# Patient Record
Sex: Male | Born: 1945 | Race: White | Hispanic: No | Marital: Single | Smoking: Former smoker
Health system: Southern US, Community
[De-identification: ages and names within clinical notes are randomized; demographics above are authoritative.]

## PROBLEM LIST (undated history)

## (undated) DIAGNOSIS — C61 Malignant neoplasm of prostate: Secondary | ICD-10-CM

## (undated) DIAGNOSIS — M1990 Unspecified osteoarthritis, unspecified site: Secondary | ICD-10-CM

## (undated) DIAGNOSIS — R351 Nocturia: Secondary | ICD-10-CM

## (undated) DIAGNOSIS — G40909 Epilepsy, unspecified, not intractable, without status epilepticus: Secondary | ICD-10-CM

## (undated) DIAGNOSIS — Z87828 Personal history of other (healed) physical injury and trauma: Secondary | ICD-10-CM

## (undated) DIAGNOSIS — I10 Essential (primary) hypertension: Secondary | ICD-10-CM

## (undated) DIAGNOSIS — Z973 Presence of spectacles and contact lenses: Secondary | ICD-10-CM

## (undated) HISTORY — PX: BACK SURGERY: SHX140

## (undated) HISTORY — PX: TONSILLECTOMY: SUR1361

---

## 2001-04-08 ENCOUNTER — Emergency Department (HOSPITAL_COMMUNITY): Admission: EM | Admit: 2001-04-08 | Discharge: 2001-04-08 | Payer: Self-pay | Admitting: Emergency Medicine

## 2001-04-19 ENCOUNTER — Emergency Department (HOSPITAL_COMMUNITY): Admission: EM | Admit: 2001-04-19 | Discharge: 2001-04-19 | Payer: Self-pay | Admitting: Emergency Medicine

## 2001-07-31 ENCOUNTER — Emergency Department (HOSPITAL_COMMUNITY): Admission: EM | Admit: 2001-07-31 | Discharge: 2001-07-31 | Payer: Self-pay | Admitting: Emergency Medicine

## 2002-02-03 ENCOUNTER — Observation Stay (HOSPITAL_COMMUNITY): Admission: RE | Admit: 2002-02-03 | Discharge: 2002-02-04 | Payer: Self-pay | Admitting: General Surgery

## 2002-02-03 HISTORY — PX: INGUINAL HERNIA REPAIR: SUR1180

## 2002-11-16 ENCOUNTER — Encounter: Payer: Self-pay | Admitting: Family Medicine

## 2002-11-16 ENCOUNTER — Ambulatory Visit (HOSPITAL_COMMUNITY): Admission: RE | Admit: 2002-11-16 | Discharge: 2002-11-16 | Payer: Self-pay | Admitting: Family Medicine

## 2003-05-10 ENCOUNTER — Other Ambulatory Visit: Admission: RE | Admit: 2003-05-10 | Discharge: 2003-05-10 | Payer: Self-pay | Admitting: Dermatology

## 2003-05-18 ENCOUNTER — Encounter: Payer: Self-pay | Admitting: Emergency Medicine

## 2003-05-18 ENCOUNTER — Inpatient Hospital Stay (HOSPITAL_COMMUNITY): Admission: AD | Admit: 2003-05-18 | Discharge: 2003-05-20 | Payer: Self-pay | Admitting: Neurosurgery

## 2003-05-19 HISTORY — PX: LAMINECTOMY AND MICRODISCECTOMY LUMBAR SPINE: SHX1913

## 2003-10-05 ENCOUNTER — Ambulatory Visit (HOSPITAL_COMMUNITY): Admission: RE | Admit: 2003-10-05 | Discharge: 2003-10-05 | Payer: Self-pay | Admitting: Family Medicine

## 2005-09-30 ENCOUNTER — Emergency Department (HOSPITAL_COMMUNITY): Admission: EM | Admit: 2005-09-30 | Discharge: 2005-09-30 | Payer: Self-pay | Admitting: Emergency Medicine

## 2005-10-08 ENCOUNTER — Ambulatory Visit (HOSPITAL_COMMUNITY): Admission: RE | Admit: 2005-10-08 | Discharge: 2005-10-08 | Payer: Self-pay | Admitting: Specialist

## 2007-04-05 ENCOUNTER — Ambulatory Visit (HOSPITAL_COMMUNITY): Admission: RE | Admit: 2007-04-05 | Discharge: 2007-04-05 | Payer: Self-pay | Admitting: Family Medicine

## 2007-04-18 ENCOUNTER — Ambulatory Visit (HOSPITAL_COMMUNITY): Admission: RE | Admit: 2007-04-18 | Discharge: 2007-04-18 | Payer: Self-pay | Admitting: Family Medicine

## 2008-01-10 ENCOUNTER — Ambulatory Visit (HOSPITAL_COMMUNITY): Admission: RE | Admit: 2008-01-10 | Discharge: 2008-01-10 | Payer: Self-pay | Admitting: Family Medicine

## 2008-09-05 ENCOUNTER — Ambulatory Visit (HOSPITAL_COMMUNITY): Admission: RE | Admit: 2008-09-05 | Discharge: 2008-09-05 | Payer: Self-pay | Admitting: Family Medicine

## 2009-04-26 ENCOUNTER — Ambulatory Visit (HOSPITAL_COMMUNITY): Admission: RE | Admit: 2009-04-26 | Discharge: 2009-04-26 | Payer: Self-pay | Admitting: Family Medicine

## 2010-06-13 NOTE — H&P (Signed)
NAME:  Darin Williamson, Darin Williamson                           ACCOUNT NO.:  [id number]   MEDICAL RECORD NO.:  [id number]                   PATIENT TYPE:  INP   LOCATION:  3036                                 FACILITY:  [facility]   PHYSICIAN:  Hewitt Shorts, M.D.            DATE OF BIRTH:  April 04, 1945   DATE OF ADMISSION:  05/18/2003  DATE OF DISCHARGE:                                HISTORY & PHYSICAL   HISTORY OF PRESENT ILLNESS:  The patient is a 65 year old right handed white  male who strained his back a couple of weeks ago when he was working a  ladder he had leaned backwards. He said that he did not actually fall but  that when he quickly recovered back onto the ladder he had strained his  back. Initially the discomfort was mild but steadily worsened. He has had  pain in the low back and down to the left lower extremity. This morning, he  experienced severe pain up to the left lower extremity with cramping in the  left calf. It was incapacitating, and he was unable to stand or walk.   He describes the pain as radiating from the side of the low back down to the  left buttock and around the lateral aspect of the left hip, anterior lateral  left thigh, and anterior medial left leg.   The patient presented to the [facility] emergency room and was  evaluated by Dr. Greta Doom. MRI was arranged. He was given dose of  prednisone, subsequently given a dose of Solu-Medrol. The MRI revealed large  left L3-4 lumbar disk herniation, and the emergency room visit from Dr.  Colon Branch contacted his primary physician Dr. Sherwood Gambler who recommended  neurosurgical care. The patient was therefore transferred to [facility] at the request of Dr. Colon Branch and Dr. Sherwood Gambler for  neurosurgical evaluation and treatment.   The patient did not describe any specific numbness, paresthesias, or  weakness. He had been treated by Dr. Sherwood Gambler with anti-inflammatory  medications, but unfortunately his pain  worsened further.   PAST MEDICAL HISTORY:  Notable for seizure disorder since age 29. He takes  Dilantin 200 mg b.i.d. He denies any history of hypertension or myocardial  infarction, cancer, stroke, diabetes, peptic ulcer disease, or lung disease.  His only previous surgery was a herniorrhaphy in January of 2004. He denies  allergies to medications, and his only current medication is Dilantin 200 mg  b.i.d.   FAMILY HISTORY:  His parents are both in good health.   SOCIAL HISTORY:  The patient is unmarried. He works at [employer]. He  smokes a pack a day and does not drink alcoholic beverages.   REVIEW OF SYSTEMS:  Notable for as described in his history of present  illness and past medical history but is otherwise unremarkable.   PHYSICAL EXAMINATION:  GENERAL:  The patient is a well-developed, well-  nourished, white male in no acute distress.  VITAL SIGNS:  His temperature is 97, pulse 55, blood pressure 133/76,  respiratory rate 18.  LUNGS:  Clear to auscultation. He has symmetrical respiratory excursion.  HEART:  Regular rate and rhythm. Normal S1 and S2. There is no murmur.  ABDOMEN:  Soft, nontender, nondistended, bowel sounds are present.  EXTREMITIES:  Shows no clubbing, cyanosis, or edema.  MUSCULOSKELETAL:  Positive straight leg raising on the left at about 45  degrees. Negative straight leg raise on the right.  NEUROLOGICAL:  Shows 5/5 strength in the lower extremities including the  iliopsoas, quadriceps, dorsi flexion, and plantar flexion bilaterally.  Sensation is intact to pinprick through the lower extremities. Reflexes left  quadriceps is 2, right is trace to 1, the right gastrocnemius is 1, the left  is 2. Toes are downgoing bilaterally. Gait and stance were not tested.   IMPRESSION:  Acute left lumbar radiculopathy secondary to a large left L3-4  lumbar disk herniation superimposed upon underlying lumbar spondylosis and  degenerative disk disease.   PLAN:   The patient will be admitted to the 3,000 unit for evaluation and  care. Preoperative laboratories will be obtained. The patient will be  continued on analgesics, and he may well require surgery. Final decision  will be made tomorrow.                                                Hewitt Shorts, M.D.    RWN/MEDQ  D:  05/18/2003  T:  05/20/2003  Job:  [id number]

## 2010-06-13 NOTE — Op Note (Signed)
NAME:  Darin Williamson, Darin Williamson                           ACCOUNT NO.:  [id number]   MEDICAL RECORD NO.:  [id number]                   PATIENT TYPE:  OBV   LOCATION:  A319                                 FACILITY:  [facility]   PHYSICIAN:  Barbaraann Barthel, M.D.              DATE OF BIRTH:  Oct 22, 1945   DATE OF PROCEDURE:  02/03/2002  DATE OF DISCHARGE:                                 OPERATIVE REPORT   PREOPERATIVE DIAGNOSIS:  Right inguinal hernia.   PROCEDURE:  Right inguinal herniorrhaphy (Modified McVay repair).   CLINICAL NOTE:  This is a 65 year old white male who presented with a non-  incarcerated right inguinal hernia.  We had planned elective repair around  his work schedule.  We discussed this in detail with the patient and his  family, including complications not limited to but including bleeding,  infection, and recurrence.  Informed consent was obtained.   SPECIMENS:  Properitoneal (lipoma of the cord) and inguinal hernia sac.   OPERATIVE FINDINGS:  Most consistent with an indirect hernia defect with  large properitoneal fat present and some weakness consistent with a direct  hernia defect.   DESCRIPTION OF PROCEDURE:  The patient was placed in the supine position  after the adequate administration of LMA anesthesia, and his entire abdomen  was prepped with Betadine solution and draped in the usual manner.  Prior to  this, a Foley catheter had been aseptically inserted.  An incision was  carried out through the skin, subcutaneous tissue, and Scarpa's layer in a  low transverse fashion between the anterior superior iliac spine and the  pubic tubercle.  The external oblique was opened through the external ring.  The cord structures were dissected free from the hernia sac and large  lipomas, which were ligated with 2-0 silk and removed as specimen.  The  hernia sac was opened and then suture ligated doubly.  We then sutured  transversus abdominis and transversalis fascia to Cooper's  ligament and  Poupart's ligament with interrupted 2-0 Bralon suture.  I used approximately  9-10 cubic centimeters of 0.5% Sensorcaine to help with postoperative  discomfort and after irrigating with normal saline solution, the cord  structures were then returned to their anatomic position and the external  oblique was approximated over the cord structures with a running 3-0  Polysorb suture.  Subcu was irrigated, and the skin was approximated  with a stapling device.  Prior to closure, all sponge, needle, and  instrument counts were found to be correct.  Estimated blood loss was  minimal.  The patient received approximately 900 cubic centimeters of  crystalloids intraoperatively.  There were no complications.  Barbaraann Barthel, M.D.    WB/MEDQ  D:  02/03/2002  T:  02/03/2002  Job:  [id number]   cc:   Madelin Rear. Sherwood Gambler, M.D.  [id number]  [place]  [place] [address]  Fax: [phone]

## 2010-06-13 NOTE — Op Note (Signed)
NAME:  Darin Williamson, Darin Williamson                           ACCOUNT NO.:  [id number]   MEDICAL RECORD NO.:  [id number]                   PATIENT TYPE:  INP   LOCATION:  3036                                 FACILITY:  [facility]   PHYSICIAN:  Hewitt Shorts, M.D.            DATE OF BIRTH:  July 16, 1945   DATE OF PROCEDURE:  05/19/2003  DATE OF DISCHARGE:  05/20/2003                                 OPERATIVE REPORT   PREOPERATIVE DIAGNOSES:  1. Left L3-4 lumbar disk herniation.  2. Lumbar spondylosis.  3. Lumbar degenerative disk disease.  4. Lumbar radiculopathy.   POSTOPERATIVE DIAGNOSES:  1. Left L3-4 lumbar disk herniation.  2. Lumbar spondylosis.  3. Lumbar degenerative disk disease.  4. Lumbar radiculopathy.   PROCEDURE:  Left L3-4 lumbar laminotomy and microdiskectomy with  microdissection.   SURGEON:  Hewitt Shorts, M.D.   ANESTHESIA:  General endotracheal.   INDICATION:  The patient is a 65 year old man, who presented with an acute  left L4 radiculopathy subsequently to a left L3-4 lumbar disk herniation  superimposed upon underlying degenerative disk disease and spondylosis.  A  decision was made to pursue elective laminotomy and microdiskectomy.   DESCRIPTION OF PROCEDURE:  The patient brought to the operating room, placed  under general endotracheal anesthesia.  The patient was turned to a prone  position; lumbar region is prepped with DuraPrep and draped in a sterile  fashion.  The midline was infiltrated with local anesthetic with  epinephrine.  An x-ray was taken, the L3-4 identified, and a midline made  and carried down to the subcutaneous tissue with bipolar electrocautery.  Electrocautery used to maintain hemostasis.  The incision was carried down  to the lumbar fascia which was incised to the left side of the midline and  the paraspinous muscles with dissection of the spinous process of the lamina  in a subperiosteal fashion.  The L3-4 intralaminar space was  identified and  x-ray was taken to confirm the localization.  And then a laminotomy was  performed using the [facility] Max drill and Kerrison punches.  We draped the  microscope, and it was brought into the field to provide ___________  magnification, illumination, and visualization, and the remainder of the  decompression was performed using microdissection in microsurgical  technique.  The ligamentum flavum was carefully resected, and we were able  to expose the thecal sac and left L4 nerve root.  These structures were  gently retracted medially, and we encountered a free fragment of disk  herniation.  This disk herniation was removed, and we were able to  decompress the thecal sac and nerve root.  We then examined the disk,  specifically the annulus at the L3-4 level and found that it was degenerated  and bulging but felt that the acute neural compression had risen from the  free fragment and that that had been relieved by removing the free fragment  and  that entering the disk space would not really achieve significantly more  decompression.  That being the case, it was decided to establish hemostasis  which was done with bipolar cautery, and then we instilled 2 mL of fentanyl  and 80 mg of Depo-Medrol into the epidural space and proceeded with closure.  The deep fascia was closed with interrupted undyed #1 Vicryl suture, the  subcutaneous layer was closed with interrupted inverted 2-0 interrupted  sutures, and the skin was reapproximated with Dermabond.  The patient  tolerated the procedure well.  The estimated blood loss was 25 mL.  Sponge  and needle count were correct.  Following surgery, the patient was turned  back to a supine position, reversed anesthetic, extubated and transferred to  the recovery room for further care.                                               Hewitt Shorts, M.D.    RWN/MEDQ  D:  05/19/2003  T:  05/20/2003  Job:  [id number]

## 2010-06-13 NOTE — Procedures (Signed)
NAME:  Darin Williamson, Darin Williamson                 ACCOUNT NO.:  [id number]   MEDICAL RECORD NO.:  [id number]          PATIENT TYPE:  OUT   LOCATION:  RAD                           FACILITY:  [facility]   PHYSICIAN:  Dani Gobble, MD       DATE OF BIRTH:  01-11-1946   DATE OF PROCEDURE:  10/08/2005  DATE OF DISCHARGE:                                  ECHOCARDIOGRAM   PROCEDURE:  Echocardiogram.   PHYSICIAN:  Dani Gobble, MD   INDICATIONS FOR PROCEDURE:  This 65 year old gentleman with visual changes,  without a prior cardiac history, who is referred for LV function.   DESCRIPTION OF PROCEDURE:  The technical quality of the study is adequate.  The aorta measures normally at 2.8 cm.  The left atrium appears mildly  dilated.  The anterior septum and posterior wall are mildly thickened and  measure 1.3 cm and 1.4 cm respectively.  The aortic valve appears minimally  thickened but with normal leaflet excursion.  No significant aortic  insufficiency is noted.  Doppler interrogation of the aortic valve is within  normal limits.  The mitral valve also appears normally thickened without  limitation of leaflet excursion.  No mitral valve prolapse is noted.  Mild  mitral annular calcification is noted.  Trace to mild mitral regurgitation  is noted.  Doppler interrogation of the mitral valve is within normal  limits.  The pulmonic valve is incompletely visualized, but appeared to be  grossly structurally normal.  The tricuspid valve also appears grossly  structurally normal with trace to mild tricuspid regurgitation noted.   The left ventricle is normal in size with the LV IDD measured at 4.6 cm and  the LV ISD measured at 2.8 cm.  Overall the left ventricular systolic  function is normal, and no regional wall motion abnormality is noted.  Small  anterior echo free space without hemodynamic compromise which may well be  epicardial fat pad, versus trivial pericardial effusion.   The right atrium appears normal in  size.  The right ventricle is mildly  dilated with preserved right ventricular systolic function.   IMPRESSION:  1. Mild left atrial enlargement.  2. Mild concentric left ventricular hypertrophy.  3. Minimal sclerosis of the aortic and mitral valves, without limitation      to leaflet excursion.  4. Trace to mild mitral and tricuspid regurgitation.  5. Normal left ventricular size and systolic function without regional      wall motion abnormality noted.  6. Mild right ventricular enlargement with preserved right ventricular      systolic function.  7. Small anterior echo free space without hemodynamic compromise, which      may well be epicardial fat pad, versus trivial pericardial effusion.           ______________________________  Dani Gobble, MD     AB/MEDQ  D:  10/08/2005  T:  10/09/2005  Job:  [id number]   cc:   Dani Gobble, MD  Fax: [phone]

## 2010-10-14 ENCOUNTER — Telehealth: Payer: Self-pay

## 2010-10-14 NOTE — Telephone Encounter (Signed)
Pt said he is on vacation this week, goes back to work on Monday. York Spaniel he will call when he is ready.

## 2010-10-22 ENCOUNTER — Telehealth: Payer: Self-pay | Admitting: Gastroenterology

## 2010-10-23 NOTE — Telephone Encounter (Signed)
Called, LMOM to call.  

## 2010-10-23 NOTE — Telephone Encounter (Signed)
Routed to triage nurse

## 2010-10-24 NOTE — Telephone Encounter (Signed)
Pt scheduled for OV on 10/28/2010 @ 8:30 AM with Gerrit Halls, NP. ( Has constipation with a little blood sometimes)

## 2010-10-28 ENCOUNTER — Ambulatory Visit (INDEPENDENT_AMBULATORY_CARE_PROVIDER_SITE_OTHER): Payer: BC Managed Care – PPO | Admitting: Gastroenterology

## 2010-10-28 ENCOUNTER — Encounter: Payer: Self-pay | Admitting: Gastroenterology

## 2010-10-28 VITALS — BP 140/80 | HR 59 | Temp 97.4°F | Ht 69.0 in | Wt 183.8 lb

## 2010-10-28 DIAGNOSIS — Z1211 Encounter for screening for malignant neoplasm of colon: Secondary | ICD-10-CM | POA: Insufficient documentation

## 2010-10-28 NOTE — Progress Notes (Signed)
  Referring Provider: Dr. Phillips Odor Primary Care Physician:  Dr. Phillips Odor Primary Gastroenterologist:  Dr. Jena Gauss   Chief Complaint  Patient presents with  . Colonoscopy    HPI:   Darin Williamson presents today for an initial screening colonoscopy, referred by Dr. Phillips Odor. No change in bowel habits, has BM every 2-3 days. He has had intermittent episodes of paper hematochezia. No rectal discomfort. No loss of appetite or wt loss. No abdominal pain, nausea or vomiting. No upper GI symptoms. No FH of colon cancer.   Past Medical History  Diagnosis Date  . Seizure     Hx of seizures many years ago  . Hyperlipidemia     Past Surgical History  Procedure Date  . Inguinal hernia repair     right side    Current Outpatient Prescriptions  Medication Sig Dispense Refill  . phenytoin (DILANTIN) 100 MG ER capsule Take 100 mg by mouth 3 (three) times daily. 2 cap by mouth in the am One cap in the evening         Allergies as of 10/28/2010  . (No Known Allergies)    Family History  Problem Relation Age of Onset  . Colon cancer Neg Hx     History   Social History  . Marital Status: Single    Spouse Name: N/A    Number of Children: 0  . Years of Education: N/A   Occupational History  . Full-Time [employer]   Social History Main Topics  . Smoking status: Former Smoker -- 0.5 packs/day    Types: Cigarettes  . Smokeless tobacco: Not on file   Comment: quit 12 yrs ago  . Alcohol Use: No  . Drug Use: No  . Sexually Active: Not on file   Other Topics Concern  . Not on file   Social History Narrative  . No narrative on file    Review of Systems: Gen: Denies any fever, chills, loss of appetite, fatigue, weight loss. CV: Denies chest pain, heart palpitations, syncope, peripheral edema. Resp: Denies shortness of breath with rest, cough, wheezing GI: Denies dysphagia or odynophagia. Denies hematemesis, fecal incontinence, or jaundice.  GU : Denies urinary burning, urinary frequency,  urinary incontinence.  MS: Denies joint pain, muscle weakness, cramps, limited movement Derm: Denies rash, itching, dry skin Psych: Denies depression, anxiety, confusion or memory loss  Heme: Denies bruising, bleeding, and enlarged lymph nodes.  Physical Exam: BP 140/80  Pulse 59  Temp(Src) 97.4 F (36.3 C) (Temporal)  Ht 5\' 9"  (1.753 m)  Wt 183 lb 12.8 oz (83.371 kg)  BMI 27.14 kg/m2 General:   Alert and oriented. Well-developed, well-nourished, pleasant and cooperative. Head:  Normocephalic and atraumatic. Eyes:  Conjunctiva pink, sclera clear, no icterus.    Ears:  Normal auditory acuity. Nose:  No deformity, discharge,  or lesions. Mouth:  No deformity or lesions, mucosa pink and moist.  Neck:  Supple, without mass or thyromegaly. Lungs:  Clear to auscultation bilaterally, without wheezing, rales, or rhonchi.  Heart:  S1, S2 present without murmurs noted.  Abdomen:  +BS, soft, non-tender and non-distended. Without mass or HSM. No rebound or guarding. Small umbilical hernia noted. Rectal:  Deferred  Msk:  Symmetrical without gross deformities. Normal posture. Extremities:  Without clubbing or edema. Neurologic:  Alert and  oriented x4;  grossly normal neurologically. Skin:  Intact, warm and dry without significant lesions or rashes Cervical Nodes:  No significant cervical adenopathy. Psych:  Alert and cooperative. Normal mood and affect.

## 2010-10-28 NOTE — Progress Notes (Signed)
Cc to PCP 

## 2010-10-28 NOTE — Assessment & Plan Note (Signed)
65 year old male referred by Dr. Phillips Odor for first ever colonoscopy, age 65. He has no FH of colon cancer; he has noted intermittent episodes of paper hematochezia. Otherwise, he has had no change in bowel habits, abdominal pain, loss of appetite or unexplained weight loss. Likely low-volume hematochezia secondary to benign anorectal source. Patient needs screening colonoscopy, and we will proceed in this fashion.  Proceed with TCS with Dr. Jena Gauss in near future: the risks, benefits, and alternatives have been discussed with the patient in detail. The patient states understanding and desires to proceed.

## 2010-10-28 NOTE — Patient Instructions (Signed)
We have set you up for a colonoscopy with Dr. Jena Gauss.  No changes to medications.  Further recommendations to follow.

## 2010-11-10 MED ORDER — SODIUM CHLORIDE 0.45 % IV SOLN
Freq: Once | INTRAVENOUS | Status: AC
  Administered 2010-11-11: 11:00:00 via INTRAVENOUS

## 2010-11-11 ENCOUNTER — Encounter (HOSPITAL_COMMUNITY)
Admission: RE | Disposition: A | Discharge status: Home / Self Care | Payer: Self-pay | Source: Ambulatory Visit | Attending: Internal Medicine

## 2010-11-11 ENCOUNTER — Encounter (HOSPITAL_COMMUNITY): Payer: Self-pay | Admitting: *Deleted

## 2010-11-11 ENCOUNTER — Ambulatory Visit (HOSPITAL_COMMUNITY)
Admission: RE | Admit: 2010-11-11 | Discharge: 2010-11-11 | Disposition: A | Discharge status: Home / Self Care | Payer: BC Managed Care – PPO | Source: Ambulatory Visit | Attending: Internal Medicine | Admitting: Internal Medicine

## 2010-11-11 DIAGNOSIS — Z1211 Encounter for screening for malignant neoplasm of colon: Secondary | ICD-10-CM | POA: Insufficient documentation

## 2010-11-11 DIAGNOSIS — K5730 Diverticulosis of large intestine without perforation or abscess without bleeding: Secondary | ICD-10-CM | POA: Insufficient documentation

## 2010-11-11 DIAGNOSIS — K648 Other hemorrhoids: Secondary | ICD-10-CM

## 2010-11-11 DIAGNOSIS — E785 Hyperlipidemia, unspecified: Secondary | ICD-10-CM | POA: Insufficient documentation

## 2010-11-11 HISTORY — PX: COLONOSCOPY: SHX5424

## 2010-11-11 SURGERY — COLONOSCOPY
Anesthesia: Moderate Sedation

## 2010-11-11 MED ORDER — MIDAZOLAM HCL 5 MG/5ML IJ SOLN
INTRAMUSCULAR | Status: AC
  Filled 2010-11-11: qty 10

## 2010-11-11 MED ORDER — MEPERIDINE HCL 100 MG/ML IJ SOLN
INTRAMUSCULAR | Status: AC
  Filled 2010-11-11: qty 2

## 2010-11-11 MED ORDER — MEPERIDINE HCL 100 MG/ML IJ SOLN
INTRAMUSCULAR | Status: DC | PRN
  Administered 2010-11-11: 50 mg via INTRAVENOUS
  Administered 2010-11-11: 25 mg via INTRAVENOUS

## 2010-11-11 MED ORDER — MIDAZOLAM HCL 5 MG/5ML IJ SOLN
INTRAMUSCULAR | Status: DC | PRN
  Administered 2010-11-11: 2 mg via INTRAVENOUS
  Administered 2010-11-11: 1 mg via INTRAVENOUS

## 2010-11-11 NOTE — H&P (Signed)
Darin Halls, NP  10/28/2010 10:09 AM  Signed   Referring Provider: Dr. Phillips Odor Primary Care Physician:  Dr. Phillips Odor Primary Gastroenterologist:  Dr. Jena Gauss     Chief Complaint   Patient presents with   .  Colonoscopy      HPI:   Mr. Novosad presents today for an initial screening colonoscopy, referred by Dr. Phillips Odor. No change in bowel habits, has BM every 2-3 days. He has had intermittent episodes of paper hematochezia. No rectal discomfort. No loss of appetite or wt loss. No abdominal pain, nausea or vomiting. No upper GI symptoms. No FH of colon cancer.      Past Medical History   Diagnosis  Date   .  Seizure         Hx of seizures many years ago   .  Hyperlipidemia         Past Surgical History   Procedure  Date   .  Inguinal hernia repair         right side       Current Outpatient Prescriptions   Medication  Sig  Dispense  Refill   .  phenytoin (DILANTIN) 100 MG ER capsule  Take 100 mg by mouth 3 (three) times daily. 2 cap by mouth in the am  One cap in the evening              Allergies as of 10/28/2010   .  (No Known Allergies)       Family History   Problem  Relation  Age of Onset   .  Colon cancer  Neg Hx         History       Social History   .  Marital Status:  Single       Spouse Name:  N/A       Number of Children:  0   .  Years of Education:  N/A       Occupational History   .  Full-Time  [employer]       Social History Main Topics   .  Smoking status:  Former Smoker -- 0.5 packs/day       Types:  Cigarettes   .  Smokeless tobacco:  Not on file     Comment: quit 12 yrs ago   .  Alcohol Use:  No   .  Drug Use:  No   .  Sexually Active:  Not on file       Other Topics  Concern   .  Not on file       Social History Narrative   .  No narrative on file      Review of Systems: Gen: Denies any fever, chills, loss of appetite, fatigue, weight loss. CV: Denies chest pain, heart palpitations, syncope, peripheral edema. Resp: Denies  shortness of breath with rest, cough, wheezing GI: Denies dysphagia or odynophagia. Denies hematemesis, fecal incontinence, or jaundice.   GU : Denies urinary burning, urinary frequency, urinary incontinence.   MS: Denies joint pain, muscle weakness, cramps, limited movement Derm: Denies rash, itching, dry skin Psych: Denies depression, anxiety, confusion or memory loss   Heme: Denies bruising, bleeding, and enlarged lymph nodes.   Physical Exam: BP 140/80  Pulse 59  Temp(Src) 97.4 F (36.3 C) (Temporal)  Ht 5\' 9"  (1.753 m)  Wt 183 lb 12.8 oz (83.371 kg)  BMI 27.14 kg/m2 General:   Alert and oriented. Well-developed, well-nourished, pleasant and cooperative. Head:  Normocephalic  and atraumatic. Eyes:  Conjunctiva pink, sclera clear, no icterus.     Ears:  Normal auditory acuity. Nose:  No deformity, discharge,  or lesions. Mouth:  No deformity or lesions, mucosa pink and moist.   Neck:  Supple, without mass or thyromegaly. Lungs:  Clear to auscultation bilaterally, without wheezing, rales, or rhonchi.   Heart:  S1, S2 present without murmurs noted.   Abdomen:  +BS, soft, non-tender and non-distended. Without mass or HSM. No rebound or guarding. Small umbilical hernia noted. Rectal:  Deferred   Msk:  Symmetrical without gross deformities. Normal posture. Extremities:  Without clubbing or edema. Neurologic:  Alert and  oriented x4;  grossly normal neurologically. Skin:  Intact, warm and dry without significant lesions or rashes Cervical Nodes:  No significant cervical adenopathy. Psych:  Alert and cooperative. Normal mood and affect.       Glendora Score  10/28/2010 10:24 AM  Signed Cc to PCP        Screening for colon cancer - Darin Halls, NP  10/28/2010 10:09 AM  Signed 65 year old male referred by Dr. Phillips Odor for first ever colonoscopy, age 65. He has no FH of colon cancer; he has noted intermittent episodes of paper hematochezia. Otherwise, he has had no change in bowel  habits, abdominal pain, loss of appetite or unexplained weight loss. Likely low-volume hematochezia secondary to benign anorectal source. Patient needs screening colonoscopy, and we will proceed in this fashion.   Proceed with TCS with Dr. Jena Gauss in near future: the risks, benefits, and alternatives have been discussed with the patient in detail. The patient states understanding and desires to proceed.      I have seen & examined the patient prior to the procedure(s) today and reviewed the history and physical/consultation.  There have been no changes.  After consideration of the risks, benefits, alternatives and imponderables, the patient has consented to the procedure(s).

## 2010-11-11 NOTE — Discharge Instructions (Signed)
°  Colonoscopy Discharge Instructions  Read the instructions outlined below and refer to this sheet in the next few weeks. These discharge instructions provide you with general information on caring for yourself after you leave the hospital. Your doctor may also give you specific instructions. While your treatment has been planned according to the most current medical practices available, unavoidable complications occasionally occur. If you have any problems or questions after discharge, call Dr. Jena Gauss at [phone]. ACTIVITY  You may resume your regular activity, but move at a slower pace for the next 24 hours.   Take frequent rest periods for the next 24 hours.   Walking will help get rid of the air and reduce the bloated feeling in your belly (abdomen).   No driving for 24 hours (because of the medicine (anesthesia) used during the test).    Do not sign any important legal documents or operate any machinery for 24 hours (because of the anesthesia used during the test).  NUTRITION  Drink plenty of fluids.   You may resume your normal diet as instructed by your doctor.   Begin with a light meal and progress to your normal diet. Heavy or fried foods are harder to digest and may make you feel sick to your stomach (nauseated).   Avoid alcoholic beverages for 24 hours or as instructed.  MEDICATIONS  You may resume your normal medications unless your doctor tells you otherwise.  WHAT YOU CAN EXPECT TODAY  Some feelings of bloating in the abdomen.   Passage of more gas than usual.   Spotting of blood in your stool or on the toilet paper.  IF YOU HAD POLYPS REMOVED DURING THE COLONOSCOPY:  No aspirin products for 7 days or as instructed.   No alcohol for 7 days or as instructed.   Eat a soft diet for the next 24 hours.  FINDING OUT THE RESULTS OF YOUR TEST Not all test results are available during your visit. If your test results are not back during the visit, make an appointment  with your caregiver to find out the results. Do not assume everything is normal if you have not heard from your caregiver or the medical facility. It is important for you to follow up on all of your test results.  SEEK IMMEDIATE MEDICAL ATTENTION IF:  You have more than a spotting of blood in your stool.   Your belly is swollen (abdominal distention).   You are nauseated or vomiting.   You have a temperature over 101.   You have abdominal pain or discomfort that is severe or gets worse throughout the day.   Diverticulosis and hemorrhoid literature provided to the patient.  Repeat screening colonoscopy 10 years.

## 2010-11-20 ENCOUNTER — Encounter (HOSPITAL_COMMUNITY): Payer: Self-pay | Admitting: Internal Medicine

## 2010-12-25 NOTE — Progress Notes (Signed)
REVIEWED. AGREE. 

## 2011-10-27 ENCOUNTER — Ambulatory Visit (INDEPENDENT_AMBULATORY_CARE_PROVIDER_SITE_OTHER): Payer: BC Managed Care – PPO | Admitting: Urology

## 2011-10-27 DIAGNOSIS — R972 Elevated prostate specific antigen [PSA]: Secondary | ICD-10-CM

## 2011-11-06 ENCOUNTER — Other Ambulatory Visit: Payer: Self-pay | Admitting: Urology

## 2011-11-06 DIAGNOSIS — C61 Malignant neoplasm of prostate: Secondary | ICD-10-CM

## 2011-11-24 ENCOUNTER — Other Ambulatory Visit: Payer: Self-pay | Admitting: Urology

## 2011-11-24 ENCOUNTER — Ambulatory Visit (HOSPITAL_COMMUNITY)
Admission: RE | Admit: 2011-11-24 | Discharge: 2011-11-24 | Disposition: A | Discharge status: Home / Self Care | Payer: BC Managed Care – PPO | Source: Ambulatory Visit | Attending: Urology | Admitting: Urology

## 2011-11-24 DIAGNOSIS — C61 Malignant neoplasm of prostate: Secondary | ICD-10-CM

## 2011-11-24 HISTORY — PX: PROSTATE BIOPSY: SHX241

## 2011-11-24 NOTE — Progress Notes (Signed)
Lidocaine 2%           10mL injected                      Prostate biopsies performed

## 2011-12-29 ENCOUNTER — Institutional Professional Consult (permissible substitution) (INDEPENDENT_AMBULATORY_CARE_PROVIDER_SITE_OTHER): Payer: BC Managed Care – PPO | Admitting: Urology

## 2011-12-29 DIAGNOSIS — C61 Malignant neoplasm of prostate: Secondary | ICD-10-CM

## 2012-03-29 ENCOUNTER — Ambulatory Visit (INDEPENDENT_AMBULATORY_CARE_PROVIDER_SITE_OTHER): Payer: BC Managed Care – PPO | Admitting: Urology

## 2012-03-29 DIAGNOSIS — C61 Malignant neoplasm of prostate: Secondary | ICD-10-CM

## 2012-08-02 ENCOUNTER — Ambulatory Visit (INDEPENDENT_AMBULATORY_CARE_PROVIDER_SITE_OTHER): Payer: BC Managed Care – PPO | Admitting: Urology

## 2012-08-02 DIAGNOSIS — C61 Malignant neoplasm of prostate: Secondary | ICD-10-CM

## 2012-11-29 ENCOUNTER — Encounter (INDEPENDENT_AMBULATORY_CARE_PROVIDER_SITE_OTHER): Payer: Self-pay

## 2012-11-29 ENCOUNTER — Ambulatory Visit (INDEPENDENT_AMBULATORY_CARE_PROVIDER_SITE_OTHER): Payer: Medicare Other | Admitting: Urology

## 2012-11-29 ENCOUNTER — Other Ambulatory Visit: Payer: Self-pay | Admitting: Urology

## 2012-11-29 DIAGNOSIS — C61 Malignant neoplasm of prostate: Secondary | ICD-10-CM

## 2013-04-04 ENCOUNTER — Ambulatory Visit (HOSPITAL_COMMUNITY)
Admission: RE | Admit: 2013-04-04 | Discharge: 2013-04-04 | Disposition: A | Discharge status: Home / Self Care | Payer: Medicare HMO | Source: Ambulatory Visit | Attending: Urology | Admitting: Urology

## 2013-04-04 ENCOUNTER — Encounter (HOSPITAL_COMMUNITY): Payer: Self-pay

## 2013-04-04 ENCOUNTER — Other Ambulatory Visit: Payer: Self-pay | Admitting: Urology

## 2013-04-04 VITALS — BP 164/94 | HR 68 | Temp 97.0°F | Resp 16

## 2013-04-04 DIAGNOSIS — C61 Malignant neoplasm of prostate: Secondary | ICD-10-CM | POA: Insufficient documentation

## 2013-04-04 HISTORY — DX: Malignant neoplasm of prostate: C61

## 2013-04-04 HISTORY — PX: PROSTATE BIOPSY: SHX241

## 2013-04-04 MED ORDER — LIDOCAINE HCL (PF) 2 % IJ SOLN
10.0000 mL | Freq: Once | INTRAMUSCULAR | Status: AC
  Administered 2013-04-04: 10 mL

## 2013-04-04 MED ORDER — GENTAMICIN SULFATE 40 MG/ML IJ SOLN
160.0000 mg | Freq: Once | INTRAMUSCULAR | Status: AC
  Administered 2013-04-04: 160 mg via INTRAMUSCULAR

## 2013-04-04 MED ORDER — LIDOCAINE HCL (PF) 2 % IJ SOLN
INTRAMUSCULAR | Status: AC
  Administered 2013-04-04: 10 mL
  Filled 2013-04-04: qty 10

## 2013-04-04 MED ORDER — GENTAMICIN SULFATE 40 MG/ML IJ SOLN
INTRAMUSCULAR | Status: AC
  Administered 2013-04-04: 160 mg via INTRAMUSCULAR
  Filled 2013-04-04: qty 4

## 2013-04-04 NOTE — Discharge Instructions (Signed)
Prostate Biopsy TRUS Biopsy BEFORE THE TEST   Do not take aspirin. Do not take any medicine that has aspirin in it 7 days before your biopsy.  You may be given a medicine to take on the day of your biopsy.  You may also be given a medicine or treatment to help you go poop (laxative or enema). AFTER THE TEST  Only take medicine as told by your doctor.  It is normal to have some bleeding from your rectum for the first 5 days.  You may have blood in your pee (urine) or sperm. Finding out the results of your test Ask when your test results will be ready. Make sure you get your test results. GET HELP RIGHT AWAY IF:  You have a temperature by mouth above 102 F (38.9 C), not controlled by medicine.  You have blood in your pee for more than 5 days.  You have a lot of blood in your pee.  You have bleeding from your rectum for more than 5 days or have a lot of blood in your poop (feces).  You have severe pain. Document Released: 12/31/2008 Document Revised: 04/06/2011 Document Reviewed: 08/31/2012 [facility] Patient Information 2014 [place], [place].

## 2013-04-04 NOTE — Progress Notes (Signed)
Biopsy complete no signs of distress  

## 2013-04-11 ENCOUNTER — Ambulatory Visit (INDEPENDENT_AMBULATORY_CARE_PROVIDER_SITE_OTHER): Payer: Medicare HMO | Admitting: Urology

## 2013-04-11 DIAGNOSIS — C61 Malignant neoplasm of prostate: Secondary | ICD-10-CM

## 2013-04-17 ENCOUNTER — Encounter: Payer: Self-pay | Admitting: Urology

## 2013-05-10 ENCOUNTER — Encounter: Payer: Self-pay | Admitting: Radiation Oncology

## 2013-05-10 NOTE — Progress Notes (Signed)
GU Location of Tumor / Histology: prostate  If Prostate Cancer, Gleason Score is (3 + 4) and PSA is (4.31 on 11/28/12)  Patient presented 1.5 years ago with signs/symptoms of: elevated PSA of 4.44 for his age  Biopsies of prostate (if applicable) revealed: 40/34/74  Adenocarcinoma, 1 /12 cores, Gleason 3+3=6, volume 42 cc  04/04/13 Prostate Biopsy   Past/Anticipated interventions by urology, if any: surveillance and repeat prostate biopsy after initial bx in 2013  Past/Anticipated interventions by medical oncology, if any: none  Weight changes, if any: no  Bowel/Bladder complaints, if any:  Occasional sensation of not emptying bladder, frequency, stream starts/stops,  IPSS 4  Nausea/Vomiting, if any: no  Pain issues, if any:  no  SAFETY ISSUES:  Prior radiation? no  Pacemaker/ICD? no  Possible current pregnancy? na  Is the patient on methotrexate? no  Current Complaints / other details:  Single, works in Theatre manager at [employer], [place]. No children. Brother w/pt today.

## 2013-05-11 ENCOUNTER — Encounter: Payer: Self-pay | Admitting: Radiation Oncology

## 2013-05-11 ENCOUNTER — Ambulatory Visit
Admission: RE | Admit: 2013-05-11 | Discharge: 2013-05-11 | Disposition: A | Discharge status: Home / Self Care | Payer: Medicare HMO | Source: Ambulatory Visit | Attending: Radiation Oncology | Admitting: Radiation Oncology

## 2013-05-11 VITALS — BP 168/86 | HR 59 | Temp 98.0°F | Resp 20 | Ht 69.0 in | Wt 188.4 lb

## 2013-05-11 DIAGNOSIS — C61 Malignant neoplasm of prostate: Secondary | ICD-10-CM

## 2013-05-11 HISTORY — DX: Malignant neoplasm of prostate: C61

## 2013-05-11 HISTORY — DX: Unspecified osteoarthritis, unspecified site: M19.90

## 2013-05-11 NOTE — Progress Notes (Signed)
[facility] NEW PATIENT EVALUATION  Name: DEARIES MEIKLE MRN: [id number]  Date:   05/11/2013           DOB: 1945/08/04  Status: outpatient   CC: Leonides Grills, MD  Franchot Gallo, MD    REFERRING PHYSICIAN: Franchot Gallo, MD   DIAGNOSIS: Stage TI C. intermediate risk adenocarcinoma prostate   HISTORY OF PRESENT ILLNESS:  Darin Williamson is a 68 y.o. male who is seen today through the courtesy Dr. Diona Fanti for consideration of radiation therapy in the management of his stage TI C. intermediate risk adenocarcinoma prostate. He was noted to have a PSA of 4.44 in the fall of 2013. Ultrasound-guided biopsies revealed 1 of 12 cores containing Gleason 6 (3+3) carcinoma. The core had 40% involvement. His gland volume was approximately 42 cc. He elected for close observation/surveillance.  A repeat PSA November of this past year was 4.31. Repeat ultrasound-guided biopsies on 04/04/2013 revealed Gleason 7 (3+4) involving 5% of one core from the left lateral mid gland along with Gleason 6 (3+3) involving 5% of one core from the left lateral apex and less than 5% of one core from the right apex. His gland volume was approximately 40 cc. He is doing well from a GU and GI standpoint. His I PSS score is 4. He states that he is able to have erections.  PREVIOUS RADIATION THERAPY: No   PAST MEDICAL HISTORY:  has a past medical history of Seizure; Prostate cancer (11/24/11); Malignant neoplasm of prostate (04/04/13); and Arthritis.     PAST SURGICAL HISTORY:  Past Surgical History  Procedure Laterality Date  . Inguinal hernia repair  2004    right side  . Colonoscopy  11/11/2010    Procedure: COLONOSCOPY;  Surgeon: Daneil Dolin, MD;  [facility];  Service: Endoscopy;  Laterality: N/A;  11:30  . Prostate biopsy  11/24/11    Gleason 3, 1/12 cores positive  . Prostate biopsy  04/04/13    Gleason 7, 3/12 cores positive  . Tonsillectomy        FAMILY HISTORY: family history includes Cancer (age of onset: 26) in his cousin; Heart attack in his father. There is no history of Colon cancer. His father died of a heart attack at 44. His mother is alive and well at 77. Question of prostate cancer in a paternal cousin at age 17.   SOCIAL HISTORY:  reports that he quit smoking about 13 years ago. His smoking use included Cigarettes. He has a 52.5 pack-year smoking history. He does not have any smokeless tobacco history on file. He reports that he does not drink alcohol or use illicit drugs. Single, never married, no children. He works in Theatre manager at [employer].   ALLERGIES: Prednisone   MEDICATIONS:  Current Outpatient Prescriptions  Medication Sig Dispense Refill  . phenytoin (DILANTIN) 100 MG ER capsule Take 100 mg by mouth 3 (three) times daily. 2 cap by mouth in the am One cap in the evening        No current facility-administered medications for this encounter.     REVIEW OF SYSTEMS:  Pertinent items are noted in HPI.    PHYSICAL EXAM:  height is 5\' 9"  (1.753 m) and weight is 188 lb 6.4 oz (85.458 kg). His oral temperature is 98 F (36.7 C). His blood pressure is 168/86 and his pulse is 59. His respiration is 20.   Alert and oriented. Head and neck examination: Grossly unremarkable. Nodes: Without  palpable cervical or supraclavicular lymphadenopathy. Chest: Lungs clear. Back: Without spinal or CVA tenderness. Abdomen: Without masses organomegaly. Genitalia: Unremarkable to inspection. Rectal: The prostate gland is normal size and is without focal induration or nodularity. Extremities: Without edema.   LABORATORY DATA:  No results found for this basename: WBC, HGB, HCT, MCV, PLT   No results found for this basename: NA, K, CL, CO2   No results found for this basename: ALT, AST, GGT, ALKPHOS, BILITOT   PSA 4.31 from 11/28/2012   IMPRESSION: Stage TI C. intermediate risk adenocarcinoma prostate. I explained to the  patient and his brother that his prognosis is related to his stage, Gleason score, and PSA level. His stage and PSA level are favorable while his Gleason score of 7 is of intermediate favorability. Management options include surgery versus continued close surveillance/observation versus radiation therapy. Radiation therapy options include seed implant alone versus external beam/IMRT. We discussed the potential acute and late toxicities of radiation therapy. After lengthy discussion he is most interested in seed implantation which I think would be an excellent choice for him. His prognosis is favorable. We will move ahead with a CT arch study today and get him scheduled for seed implantation with Dr. Diona Fanti later in May.   PLAN: As discussed above.  I spent 60 minutes minutes face to face with the patient and more than 50% of that time was spent in counseling and/or coordination of care.

## 2013-05-11 NOTE — Progress Notes (Signed)
   Complex simulation/treatment planning note: The patient was taken to the CT simulator. His pelvis was scanned. The CT data set was sent to the planning system for contouring of his prostate. The prostate was projected over the bony arch. The arch is open. His prostate volume is 39.3 cc. He is a candidate for seed implantation. I am prescribing 14,500 cGy utilizing I-125 seeds. He is to be implanted with the [employer] system.

## 2013-05-11 NOTE — Progress Notes (Signed)
Please see the Nurse Progress Note in the MD Initial Consult Encounter for this patient. 

## 2013-05-11 NOTE — Addendum Note (Signed)
Encounter addended by: Andria Rhein, RN on: 05/11/2013  1:59 PM<BR>     Documentation filed: Charges VN

## 2013-05-11 NOTE — Progress Notes (Signed)
CC: Dr. Preston Fleeting   Chart note:    Darin Williamson underwent a CT arch study. His prostate volume is 39.3 cc and his arch is widely open. He is a candidate for seed implantation with Dr. Diona Fanti.

## 2013-05-12 ENCOUNTER — Telehealth: Payer: Self-pay | Admitting: *Deleted

## 2013-05-12 NOTE — Telephone Encounter (Signed)
CALLED PATIENT TO INFORM OF IMPLANT DATE, LVM FOR A RETURN CALL 

## 2013-05-16 ENCOUNTER — Other Ambulatory Visit: Payer: Self-pay | Admitting: Urology

## 2013-05-17 ENCOUNTER — Ambulatory Visit (HOSPITAL_BASED_OUTPATIENT_CLINIC_OR_DEPARTMENT_OTHER)
Admission: RE | Admit: 2013-05-17 | Discharge: 2013-05-17 | Disposition: A | Discharge status: Home / Self Care | Payer: Medicare HMO | Source: Ambulatory Visit | Attending: Urology | Admitting: Urology

## 2013-05-17 ENCOUNTER — Encounter (HOSPITAL_BASED_OUTPATIENT_CLINIC_OR_DEPARTMENT_OTHER)
Admission: RE | Admit: 2013-05-17 | Discharge: 2013-05-17 | Disposition: A | Discharge status: Home / Self Care | Payer: Medicare HMO | Source: Ambulatory Visit | Attending: Urology | Admitting: Urology

## 2013-05-17 DIAGNOSIS — C61 Malignant neoplasm of prostate: Secondary | ICD-10-CM | POA: Insufficient documentation

## 2013-05-17 DIAGNOSIS — Z01818 Encounter for other preprocedural examination: Secondary | ICD-10-CM | POA: Insufficient documentation

## 2013-06-02 ENCOUNTER — Telehealth: Payer: Self-pay | Admitting: *Deleted

## 2013-06-02 NOTE — Telephone Encounter (Signed)
Called patient to inform that has been moved time has been moved to 12 pm (start time) on 06-29-13, lvm for a return call

## 2013-06-21 ENCOUNTER — Telehealth: Payer: Self-pay | Admitting: *Deleted

## 2013-06-21 NOTE — Telephone Encounter (Signed)
CALLED PATIENT TO REMIND OF APPT. FOR 06-22-13, LVM FOR A RETURN CALL 

## 2013-06-22 ENCOUNTER — Encounter (HOSPITAL_BASED_OUTPATIENT_CLINIC_OR_DEPARTMENT_OTHER): Payer: Self-pay | Admitting: *Deleted

## 2013-06-22 LAB — COMPREHENSIVE METABOLIC PANEL
ALK PHOS: 100 U/L (ref 39–117)
ALT: 17 U/L (ref 0–53)
AST: 13 U/L (ref 0–37)
Albumin: 4.2 g/dL (ref 3.5–5.2)
BILIRUBIN TOTAL: 0.4 mg/dL (ref 0.3–1.2)
BUN: 16 mg/dL (ref 6–23)
CHLORIDE: 105 meq/L (ref 96–112)
CO2: 25 meq/L (ref 19–32)
Calcium: 9.4 mg/dL (ref 8.4–10.5)
Creatinine, Ser: 0.8 mg/dL (ref 0.50–1.35)
GFR calc Af Amer: >90 mL/min (ref >90)
Glucose, Bld: 118 mg/dL — ABNORMAL HIGH (ref 70–99)
Potassium: 4.2 mEq/L (ref 3.7–5.3)
SODIUM: 144 meq/L (ref 137–147)
Total Protein: 7.1 g/dL (ref 6.0–8.3)

## 2013-06-22 LAB — PROTIME-INR
INR: 1 (ref 0.00–1.49)
Prothrombin Time: 13 seconds (ref 11.6–15.2)

## 2013-06-22 LAB — CBC
HCT: 43.5 % (ref 39.0–52.0)
HEMOGLOBIN: 15.3 g/dL (ref 13.0–17.0)
MCH: 32.5 pg (ref 26.0–34.0)
MCHC: 35.2 g/dL (ref 30.0–36.0)
MCV: 92.4 fL (ref 78.0–100.0)
PLATELETS: 241 10*3/uL (ref 150–400)
RBC: 4.71 MIL/uL (ref 4.22–5.81)
RDW: 13 % (ref 11.5–15.5)
WBC: 8.3 10*3/uL (ref 4.0–10.5)

## 2013-06-22 LAB — APTT: aPTT: 31 seconds (ref 24–37)

## 2013-06-22 NOTE — Progress Notes (Signed)
NPO AFTER MN. ARRIVE AT 1030.  CURRENT LAB RESULTS , EKG , AND CXR IN EPIC AND CHART. WILL TAKE DILANTIN AM DOS W/ SIPS OF WATER AND DO FLEET ENEMA.

## 2013-06-28 ENCOUNTER — Telehealth: Payer: Self-pay | Admitting: *Deleted
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
## 2013-06-28 NOTE — Telephone Encounter (Signed)
Called patient to remind of procedure for 06-29-13, spoke with patient and he is aware of this procedure. 

## 2013-06-29 ENCOUNTER — Encounter (HOSPITAL_BASED_OUTPATIENT_CLINIC_OR_DEPARTMENT_OTHER): Payer: Self-pay | Admitting: Certified Registered"

## 2013-06-29 ENCOUNTER — Ambulatory Visit (HOSPITAL_BASED_OUTPATIENT_CLINIC_OR_DEPARTMENT_OTHER)
Admission: RE | Admit: 2013-06-29 | Discharge: 2013-06-29 | Disposition: A | Discharge status: Home / Self Care | Payer: Medicare HMO | Source: Ambulatory Visit | Attending: Urology | Admitting: Urology

## 2013-06-29 ENCOUNTER — Ambulatory Visit (HOSPITAL_BASED_OUTPATIENT_CLINIC_OR_DEPARTMENT_OTHER): Payer: Medicare HMO | Admitting: Anesthesiology

## 2013-06-29 ENCOUNTER — Ambulatory Visit (HOSPITAL_COMMUNITY): Payer: Medicare HMO

## 2013-06-29 ENCOUNTER — Encounter (HOSPITAL_BASED_OUTPATIENT_CLINIC_OR_DEPARTMENT_OTHER)
Admission: RE | Disposition: A | Discharge status: Home / Self Care | Payer: Self-pay | Source: Ambulatory Visit | Attending: Urology

## 2013-06-29 ENCOUNTER — Encounter: Payer: Self-pay | Admitting: Radiation Oncology

## 2013-06-29 ENCOUNTER — Emergency Department (HOSPITAL_COMMUNITY)
Admission: EM | Admit: 2013-06-29 | Discharge: 2013-06-30 | Disposition: A | Discharge status: Home / Self Care | Payer: Medicare HMO | Attending: Emergency Medicine | Admitting: Emergency Medicine

## 2013-06-29 ENCOUNTER — Encounter (HOSPITAL_BASED_OUTPATIENT_CLINIC_OR_DEPARTMENT_OTHER): Payer: Medicare HMO | Admitting: Anesthesiology

## 2013-06-29 ENCOUNTER — Encounter (HOSPITAL_COMMUNITY): Payer: Self-pay | Admitting: Emergency Medicine

## 2013-06-29 DIAGNOSIS — Z792 Long term (current) use of antibiotics: Secondary | ICD-10-CM | POA: Insufficient documentation

## 2013-06-29 DIAGNOSIS — Z8546 Personal history of malignant neoplasm of prostate: Secondary | ICD-10-CM | POA: Insufficient documentation

## 2013-06-29 DIAGNOSIS — C61 Malignant neoplasm of prostate: Secondary | ICD-10-CM | POA: Insufficient documentation

## 2013-06-29 DIAGNOSIS — Z79899 Other long term (current) drug therapy: Secondary | ICD-10-CM | POA: Insufficient documentation

## 2013-06-29 DIAGNOSIS — R339 Retention of urine, unspecified: Secondary | ICD-10-CM | POA: Insufficient documentation

## 2013-06-29 DIAGNOSIS — Z87891 Personal history of nicotine dependence: Secondary | ICD-10-CM | POA: Insufficient documentation

## 2013-06-29 DIAGNOSIS — G40909 Epilepsy, unspecified, not intractable, without status epilepticus: Secondary | ICD-10-CM | POA: Insufficient documentation

## 2013-06-29 DIAGNOSIS — Z8739 Personal history of other diseases of the musculoskeletal system and connective tissue: Secondary | ICD-10-CM | POA: Insufficient documentation

## 2013-06-29 DIAGNOSIS — Z87828 Personal history of other (healed) physical injury and trauma: Secondary | ICD-10-CM | POA: Insufficient documentation

## 2013-06-29 HISTORY — DX: Nocturia: R35.1

## 2013-06-29 HISTORY — PX: RADIOACTIVE SEED IMPLANT: SHX5150

## 2013-06-29 HISTORY — PX: CYSTOSCOPY: SHX5120

## 2013-06-29 HISTORY — DX: Epilepsy, unspecified, not intractable, without status epilepticus: G40.909

## 2013-06-29 HISTORY — DX: Presence of spectacles and contact lenses: Z97.3

## 2013-06-29 HISTORY — DX: Personal history of other (healed) physical injury and trauma: Z87.828

## 2013-06-29 SURGERY — INSERTION, RADIATION SOURCE, PROSTATE
Anesthesia: General | Site: Prostate

## 2013-06-29 MED ORDER — ONDANSETRON HCL 4 MG/2ML IJ SOLN
INTRAMUSCULAR | Status: DC | PRN
  Administered 2013-06-29: 4 mg via INTRAVENOUS

## 2013-06-29 MED ORDER — MIDAZOLAM HCL 2 MG/2ML IJ SOLN
INTRAMUSCULAR | Status: AC
Start: 2013-06-29 — End: 2013-06-29
  Filled 2013-06-29: qty 2

## 2013-06-29 MED ORDER — LIDOCAINE HCL (CARDIAC) 20 MG/ML IV SOLN
INTRAVENOUS | Status: DC | PRN
  Administered 2013-06-29: 60 mg via INTRAVENOUS

## 2013-06-29 MED ORDER — HYDROCODONE-ACETAMINOPHEN 5-325 MG PO TABS: 1.0000 | ORAL_TABLET | ORAL | Status: DC | PRN

## 2013-06-29 MED ORDER — CIPROFLOXACIN HCL 250 MG PO TABS: 250.0000 mg | ORAL_TABLET | Freq: Two times a day (BID) | ORAL | Status: DC

## 2013-06-29 MED ORDER — HYDROMORPHONE HCL PF 1 MG/ML IJ SOLN
0.2500 mg | INTRAMUSCULAR | Status: DC | PRN
  Filled 2013-06-29: qty 1

## 2013-06-29 MED ORDER — GLYCOPYRROLATE 0.2 MG/ML IJ SOLN
INTRAMUSCULAR | Status: DC | PRN
  Administered 2013-06-29: 0.4 mg via INTRAVENOUS

## 2013-06-29 MED ORDER — MEPERIDINE HCL 25 MG/ML IJ SOLN
6.2500 mg | INTRAMUSCULAR | Status: DC | PRN
  Filled 2013-06-29: qty 1

## 2013-06-29 MED ORDER — FENTANYL CITRATE 0.05 MG/ML IJ SOLN
INTRAMUSCULAR | Status: AC
  Filled 2013-06-29: qty 6

## 2013-06-29 MED ORDER — EPHEDRINE SULFATE 50 MG/ML IJ SOLN
INTRAMUSCULAR | Status: DC | PRN
  Administered 2013-06-29 (×2): 10 mg via INTRAVENOUS

## 2013-06-29 MED ORDER — ROCURONIUM BROMIDE 100 MG/10ML IV SOLN
INTRAVENOUS | Status: DC | PRN
  Administered 2013-06-29: 40 mg via INTRAVENOUS

## 2013-06-29 MED ORDER — IOHEXOL 350 MG/ML SOLN
INTRAVENOUS | Status: DC | PRN
  Administered 2013-06-29: 7 mL

## 2013-06-29 MED ORDER — MORPHINE SULFATE 2 MG/ML IJ SOLN
2.0000 mg | INTRAMUSCULAR | Status: DC | PRN
  Filled 2013-06-29: qty 1

## 2013-06-29 MED ORDER — ACETAMINOPHEN 325 MG PO TABS
650.0000 mg | ORAL_TABLET | ORAL | Status: DC | PRN
  Filled 2013-06-29: qty 2

## 2013-06-29 MED ORDER — OXYCODONE HCL 5 MG PO TABS
5.0000 mg | ORAL_TABLET | Freq: Once | ORAL | Status: DC | PRN
  Filled 2013-06-29: qty 1

## 2013-06-29 MED ORDER — STERILE WATER FOR IRRIGATION IR SOLN
Status: DC | PRN
  Administered 2013-06-29: 1000 mL via INTRAVESICAL

## 2013-06-29 MED ORDER — PROMETHAZINE HCL 25 MG/ML IJ SOLN
6.2500 mg | INTRAMUSCULAR | Status: DC | PRN
  Filled 2013-06-29: qty 1

## 2013-06-29 MED ORDER — ACETAMINOPHEN 650 MG RE SUPP
650.0000 mg | RECTAL | Status: DC | PRN
  Filled 2013-06-29: qty 1

## 2013-06-29 MED ORDER — MIDAZOLAM HCL 5 MG/5ML IJ SOLN
INTRAMUSCULAR | Status: DC | PRN
  Administered 2013-06-29: 1 mg via INTRAVENOUS

## 2013-06-29 MED ORDER — NEOSTIGMINE METHYLSULFATE 10 MG/10ML IV SOLN
INTRAVENOUS | Status: DC | PRN
  Administered 2013-06-29: 3 mg via INTRAVENOUS

## 2013-06-29 MED ORDER — SODIUM CHLORIDE 0.9 % IJ SOLN
3.0000 mL | Freq: Two times a day (BID) | INTRAMUSCULAR | Status: DC
  Filled 2013-06-29: qty 3

## 2013-06-29 MED ORDER — FENTANYL CITRATE 0.05 MG/ML IJ SOLN
INTRAMUSCULAR | Status: DC | PRN
  Administered 2013-06-29: 50 ug via INTRAVENOUS
  Administered 2013-06-29 (×2): 25 ug via INTRAVENOUS

## 2013-06-29 MED ORDER — CIPROFLOXACIN IN D5W 400 MG/200ML IV SOLN
400.0000 mg | INTRAVENOUS | Status: AC
Start: 2013-06-29 — End: 2013-06-29
  Administered 2013-06-29: 400 mg via INTRAVENOUS
  Filled 2013-06-29: qty 200

## 2013-06-29 MED ORDER — PROPOFOL 10 MG/ML IV BOLUS
INTRAVENOUS | Status: DC | PRN
  Administered 2013-06-29: 170 mg via INTRAVENOUS

## 2013-06-29 MED ORDER — SODIUM CHLORIDE 0.9 % IJ SOLN
3.0000 mL | INTRAMUSCULAR | Status: DC | PRN
  Filled 2013-06-29: qty 3

## 2013-06-29 MED ORDER — OXYCODONE HCL 5 MG/5ML PO SOLN
5.0000 mg | Freq: Once | ORAL | Status: DC | PRN
  Filled 2013-06-29: qty 5

## 2013-06-29 MED ORDER — SODIUM CHLORIDE 0.9 % IV SOLN
250.0000 mL | INTRAVENOUS | Status: DC | PRN
  Filled 2013-06-29: qty 250

## 2013-06-29 MED ORDER — LACTATED RINGERS IV SOLN
INTRAVENOUS | Status: DC
  Administered 2013-06-29 (×2): via INTRAVENOUS
  Filled 2013-06-29: qty 1000

## 2013-06-29 MED ORDER — FLEET ENEMA 7-19 GM/118ML RE ENEM
1.0000 | ENEMA | Freq: Once | RECTAL | Status: DC
  Filled 2013-06-29: qty 1

## 2013-06-29 MED ORDER — OXYCODONE HCL 5 MG PO TABS
5.0000 mg | ORAL_TABLET | ORAL | Status: DC | PRN
  Filled 2013-06-29: qty 2

## 2013-06-29 SURGICAL SUPPLY — 23 items
BAG URINE DRAINAGE (UROLOGICAL SUPPLIES) ×4 IMPLANT
BLADE SURG ROTATE 9660 (MISCELLANEOUS) ×4 IMPLANT
CATH FOLEY 2WAY SLVR  5CC 16FR (CATHETERS) ×2
CATH FOLEY 2WAY SLVR 5CC 16FR (CATHETERS) ×2 IMPLANT
CATH ROBINSON RED A/P 20FR (CATHETERS) ×4 IMPLANT
CLOTH BEACON ORANGE TIMEOUT ST (SAFETY) ×4 IMPLANT
COVER MAYO STAND STRL (DRAPES) ×4 IMPLANT
COVER TABLE BACK 60X90 (DRAPES) ×4 IMPLANT
DRSG TEGADERM 4X4.75 (GAUZE/BANDAGES/DRESSINGS) ×4 IMPLANT
DRSG TEGADERM 8X12 (GAUZE/BANDAGES/DRESSINGS) ×4 IMPLANT
GLOVE BIO SURGEON STRL SZ 6 (GLOVE) ×4 IMPLANT
GLOVE BIO SURGEON STRL SZ7.5 (GLOVE) ×16 IMPLANT
GLOVE BIO SURGEON STRL SZ8 (GLOVE) ×8 IMPLANT
GLOVE INDICATOR 6.5 STRL GRN (GLOVE) ×4 IMPLANT
GOWN STRL REUS W/TWL XL LVL3 (GOWN DISPOSABLE) ×8 IMPLANT
HOLDER FOLEY CATH W/STRAP (MISCELLANEOUS) ×4 IMPLANT
PACK CYSTOSCOPY (CUSTOM PROCEDURE TRAY) ×4 IMPLANT
Radioactive Seeds, Nucletron ×304 IMPLANT
SPONGE GAUZE 4X4 12PLY (GAUZE/BANDAGES/DRESSINGS) ×4 IMPLANT
SYRINGE 10CC LL (SYRINGE) ×4 IMPLANT
UNDERPAD 30X30 INCONTINENT (UNDERPADS AND DIAPERS) ×8 IMPLANT
WATER STERILE IRR 3000ML UROMA (IV SOLUTION) ×4 IMPLANT
WATER STERILE IRR 500ML POUR (IV SOLUTION) ×4 IMPLANT

## 2013-06-29 NOTE — Op Note (Signed)
Preoperative diagnosis: Clinical stage TI C adenocarcinoma the prostate   Postoperative diagnosis: Same   Procedure: I-125 prostate seed implantation with Nucletron robotic implanter   Surgeon: Lillette Boxer. Madilyne Tadlock M.D.  Radiation Oncologist:Murray  Anesthesia: Gen.   Indications: Patient  was diagnosed with clinical stage TI C prostate cancer. We had extensive discussion with him about treatment options versus. He elected to proceed with seed implantation. He underwent consultation my office as well as with Dr. Valere Dross. He appeared to understand the advantages disadvantages potential risks of this treatment option. Full informed consent has been obtained.   Technique and findings: Patient was brought the operating room where he had successful induction of general anesthesia. He was placed in dorso-lithotomy position and prepped and draped in usual manner. Appropriate surgical timeout was performed. Radiation oncology department placed a transrectal ultrasound probe anchoring stand. Foley catheter with contrast in the balloon was inserted without difficulty. Anchoring needles were placed within the prostate. Rectal tube was placed. Real-time contouring of the urethra prostate and rectum were performed and the dosing parameters were established. Targeted dose was 145 gray.  I was then called  to the operating suite suite for placement of the needles. A second timeout was performed. All needle passage was done with real-time transrectal ultrasound guidance with the sagittal plane. A total of 24 needles were placed. The implantation itself was done with the robotic implanter. 76 active seeds were implanted. The Foley catheter was removed and flexible cystoscopy failed to show any seeds outside the prostate.  The patient was brought to recovery room in stable condition, having tolerated the procedure well.Marland Kitchen

## 2013-06-29 NOTE — Discharge Instructions (Signed)
Radioactive Seed Implant Home Care Instructions   Activity:    Rest for the remainder of the day.  Do not drive or operate equipment today.  You may resume normal  activities in a few days as instructed by your physician, without risk of harmful radiation exposure to those around you, provided you follow the time and distance precautions on the Radiation Oncology Instruction Sheet.   Meals: Drink plenty of lipuids and eat light foods, such as gelatin or soup this evening .  You may return to normal meal plan tomorrow.  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  Special Instruction:   If any seeds are found, use tweezers to pick up seeds and place in a glass container of any kind and bring to your physician's office.  Call your physician if any of these symptoms occur:   Persistent or heavy bleeding  Urine stream diminishes or stops completely after catheter is removed  Fever equal to or greater than 101 degrees F  Cloudy urine with a strong foul odor  Severe pain  You may feel some burning pain and/or hesitancy when you urinate after the catheter is removed.  These symptoms may increase over the next few weeks, but should diminish within forur to six weeks.  Applying moist heat to the lower abdomen or a hot tub bath may help relieve the pain.  If the discomfort becomes severe, please call your physician for additional medications.  Follow-up (Date of Return Visit to Physician): ***  Patient:_______________________________   @DATE @   Post Anesthesia Home Care Instructions  Activity: Get plenty of rest for the remainder of the day. A responsible adult should stay with you for 24 hours following the procedure.  For the next 24 hours, DO NOT: -Drive a car -Paediatric nurse -Drink alcoholic beverages -Take any medication unless instructed by your physician -Make any legal decisions or sign important papers.  Meals: Start with liquid foods such  as gelatin or soup. Progress to regular foods as tolerated. Avoid greasy, spicy, heavy foods. If nausea and/or vomiting occur, drink only clear liquids until the nausea and/or vomiting subsides. Call your physician if vomiting continues.  Special Instructions/Symptoms: Your throat may feel dry or sore from the anesthesia or the breathing tube placed in your throat during surgery. If this causes discomfort, gargle with warm salt water. The discomfort should disappear within 24 hours. Nurse:________________________________ @DATE @

## 2013-06-29 NOTE — Anesthesia Preprocedure Evaluation (Signed)
Anesthesia Evaluation  Patient identified by MRN, date of birth, ID band Patient awake    Reviewed: Allergy & Precautions, H&P , NPO status , Patient's Chart, lab work & pertinent test results  Airway Mallampati: II TM Distance: >3 FB Neck ROM: Full    Dental  (+) Dental Advisory Given   Pulmonary former smoker,  breath sounds clear to auscultation        Cardiovascular negative cardio ROS  Rhythm:Regular Rate:Normal     Neuro/Psych negative neurological ROS  negative psych ROS   GI/Hepatic negative GI ROS, Neg liver ROS,   Endo/Other  negative endocrine ROS  Renal/GU negative Renal ROS     Musculoskeletal negative musculoskeletal ROS (+)   Abdominal   Peds  Hematology negative hematology ROS (+)   Anesthesia Other Findings   Reproductive/Obstetrics                           Anesthesia Physical Anesthesia Plan  ASA: II  Anesthesia Plan: General   Post-op Pain Management:    Induction: Intravenous  Airway Management Planned: LMA  Additional Equipment:   Intra-op Plan:   Post-operative Plan: Extubation in OR  Informed Consent: I have reviewed the patients History and Physical, chart, labs and discussed the procedure including the risks, benefits and alternatives for the proposed anesthesia with the patient or authorized representative who has indicated his/her understanding and acceptance.   Dental advisory given  Plan Discussed with: CRNA  Anesthesia Plan Comments:         Anesthesia Quick Evaluation

## 2013-06-29 NOTE — Anesthesia Postprocedure Evaluation (Signed)
  Anesthesia Post-op Note  Patient: Darin Williamson  Procedure(s) Performed: Procedure(s) (LRB): RADIOACTIVE SEED IMPLANT (N/A) CYSTOSCOPY FLEXIBLE (N/A)  Patient Location: PACU  Anesthesia Type: General  Level of Consciousness: awake and alert   Airway and Oxygen Therapy: Patient Spontanous Breathing  Post-op Pain: mild  Post-op Assessment: Post-op Vital signs reviewed, Patient's Cardiovascular Status Stable, Respiratory Function Stable, Patent Airway and No signs of Nausea or vomiting  Last Vitals:  Filed Vitals:   06/29/13 1430  BP: 141/94  Pulse: 69  Temp:   Resp: 14    Post-op Vital Signs: stable   Complications: No apparent anesthesia complications

## 2013-06-29 NOTE — Transfer of Care (Signed)
Immediate Anesthesia Transfer of Care Note  Patient: Darin Williamson  Procedure(s) Performed: Procedure(s) (LRB): RADIOACTIVE SEED IMPLANT (N/A) CYSTOSCOPY FLEXIBLE (N/A)  Patient Location: PACU  Anesthesia Type: General  Level of Consciousness: awake, oriented, sedated and patient cooperative  Airway & Oxygen Therapy: Patient Spontanous Breathing and Patient connected to face mask oxygen  Post-op Assessment: Report given to PACU RN and Post -op Vital signs reviewed and stable  Post vital signs: Reviewed and stable  Complications: No apparent anesthesia complications

## 2013-06-29 NOTE — Progress Notes (Signed)
CC: Dr. Elsie Lincoln, Dr. Franchot Gallo  End of treatment summary:  Diagnosis: Stage T1c  intermediate risk adenocarcinoma prostate  Requesting physician: Dr. Franchot Gallo  Site/dose: Prostate, 14,500 cGy utilizing I-125 seeds with curative intent. A total of 76 seeds were implanted in 22 active needles with an individual seed activity 0.463 mCi per seed for a total implant activity of 35.2 mCi. He was implanted with [employer] system.  Narrative: The patient appears to have undergone a successful Nucletron seed Selectron implant with Dr. Diona Fanti.  Plan: Followup visit with both of [place] in approximately 3 weeks. We will obtain a CT scan for his post implant dosimetry.

## 2013-06-29 NOTE — ED Notes (Signed)
Pt states that he had prostate seeds planted this afternoon and has been unable to void since around 4pm

## 2013-06-29 NOTE — Progress Notes (Signed)
   Radiation Oncology         [phone] ________________________________  Name: Darin Williamson MRN: [id number]  Date: 05/12/2013  DOB: [birth year]  Prostate Seed Implant  HE:NIDPOEU,MPNTIRW M, MD  No ref. 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  PROCEDURE: Insertion of radioactive I-125 seeds into the prostate gland.  RADIATION DOSE: 145 Gy, definitive therapy.  TECHNIQUE: Darin Williamson was brought to the operating room with the urologist. He was placed in the dorsolithotomy position. He was catheterized and a rectal tube was inserted. The perineum was shaved, prepped and draped. The ultrasound probe was then introduced into the rectum to see the prostate gland.  TREATMENT DEVICE: A needle grid was attached to the ultrasound probe stand and anchor needles were placed.  3D PLANNING: The prostate was imaged in 3D using a sagittal sweep of the prostate probe. These images were transferred to the planning computer. There, the prostate, urethra and rectum were defined on each axial reconstructed image. Then, the software created an optimized 3D plan and a few seed positions were adjusted. The quality of the plan was reviewed using [facility] information for the target and the following two organs at risk:  Urethra and Rectum.  Then the accepted plan was uploaded to the seed Selectron afterloading unit.  PROSTATE VOLUME STUDY:  Using transrectal ultrasound the volume of the prostate was verified to be 44.7 cc.  SPECIAL TREATMENT PROCEDURE/SUPERVISION AND HANDLING: The Nucletron FIRST system was used to place the needles under sagittal guidance. A total of 22 needles were used to deposit 76 seeds in the prostate gland. The individual seed activity was 0.463 mCi for a total implant activity of 35.2 mCi.  COMPLEX SIMULATION: At the end of the procedure, an anterior radiograph of the pelvis was obtained to  document seed positioning and count. Cystoscopy was performed to check the urethra and bladder.  MICRODOSIMETRY: At the end of the procedure, the patient was emitting 0.106 mrem/hr at 1 meter. Accordingly, he was considered safe for hospital discharge.  PLAN: The patient will return to the radiation oncology clinic for post implant CT dosimetry in three weeks.  ------------------------------------------------        Rexene Edison, MD
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
## 2013-06-29 NOTE — Anesthesia Procedure Notes (Signed)
Procedure Name: Intubation Date/Time: 06/29/2013 12:01 PM Performed by: Denna Haggard D Pre-anesthesia Checklist: Patient identified, Emergency Drugs available, Suction available and Patient being monitored Patient Re-evaluated:Patient Re-evaluated prior to inductionOxygen Delivery Method: Circle System Utilized Preoxygenation: Pre-oxygenation with 100% oxygen Intubation Type: IV induction Ventilation: Mask ventilation without difficulty Laryngoscope Size: Mac and 4 Grade View: Grade II Tube type: Oral Tube size: 8.0 mm Number of attempts: 1 Airway Equipment and Method: stylet and oral airway Placement Confirmation: ETT inserted through vocal cords under direct vision,  positive ETCO2 and breath sounds checked- equal and bilateral Secured at: 22 cm Tube secured with: Tape Dental Injury: Teeth and Oropharynx as per pre-operative assessment

## 2013-06-29 NOTE — Discharge Instructions (Signed)
Acute Urinary Retention, Male °Acute urinary retention is the temporary inability to urinate. °This is a common problem in older men. As men age their prostates become larger and block the flow of urine from the bladder. This is usually a problem that has come on gradually.  °HOME CARE INSTRUCTIONS °If you are sent home with a Foley catheter and a drainage system, you will need to discuss the best course of action with your health care provider. While the catheter is in, maintain a good intake of fluids. Keep the drainage bag emptied and lower than your catheter. This is so that contaminated urine will not flow back into your bladder, which could lead to a urinary tract infection. °There are two main types of drainage bags. One is a large bag that usually is used at night. It has a good capacity that will allow you to sleep through the night without having to empty it. The second type is called a leg bag. It has a smaller capacity, so it needs to be emptied more frequently. However, the main advantage is that it can be attached by a leg strap and can go underneath your clothing, allowing you the freedom to move about or leave your home. °Only take over-the-counter or prescription medicines for pain, discomfort, or fever as directed by your health care provider.  °SEEK MEDICAL CARE IF: °· You develop a low-grade fever. °· You experience spasms or leakage of urine with the spasms. °SEEK IMMEDIATE MEDICAL CARE IF:  °· You develop chills or fever. °· Your catheter stops draining urine. °· Your catheter falls out. °· You start to develop increased bleeding that does not respond to rest and increased fluid intake. °MAKE SURE YOU: °· Understand these instructions. °· Will watch your condition. °· Will get help right away if you are not doing well or get worse. °Document Released: 04/20/2000 Document Revised: 09/14/2012 Document Reviewed: 06/23/2012 °Exit[employer]® Patient Information ©2014 [employer]. ° °

## 2013-06-30 ENCOUNTER — Encounter (HOSPITAL_BASED_OUTPATIENT_CLINIC_OR_DEPARTMENT_OTHER): Payer: Self-pay | Admitting: Urology

## 2013-06-30 LAB — URINE MICROSCOPIC-ADD ON

## 2013-06-30 LAB — URINALYSIS, ROUTINE W REFLEX MICROSCOPIC
BILIRUBIN URINE: NEGATIVE
Glucose, UA: NEGATIVE mg/dL
Ketones, ur: NEGATIVE mg/dL
Leukocytes, UA: NEGATIVE
Nitrite: NEGATIVE
PH: 7 (ref 5.0–8.0)
Protein, ur: NEGATIVE mg/dL
SPECIFIC GRAVITY, URINE: 1.013 (ref 1.005–1.030)
Urobilinogen, UA: 0.2 mg/dL (ref 0.0–1.0)

## 2013-06-30 NOTE — ED Notes (Signed)
Patient is alert and oriented x3.  He was given DC instructions and follow up visit instructions.  Patient gave verbal understanding.  He was DC ambulatory under his own power to home.  V/S stable.  He was not showing any signs of distress on DC 

## 2013-07-19 ENCOUNTER — Telehealth: Payer: Self-pay | Admitting: *Deleted

## 2013-07-19 NOTE — Telephone Encounter (Signed)
Called patient to remind of appts. For 07-20-13, lvm for a return call 

## 2013-07-20 ENCOUNTER — Encounter: Payer: Self-pay | Admitting: Radiation Oncology

## 2013-07-20 ENCOUNTER — Ambulatory Visit
Admit: 2013-07-20 | Discharge: 2013-07-20 | Disposition: A | Discharge status: Home / Self Care | Payer: Medicare HMO | Attending: Radiation Oncology | Admitting: Radiation Oncology

## 2013-07-20 ENCOUNTER — Ambulatory Visit
Admission: RE | Admit: 2013-07-20 | Discharge: 2013-07-20 | Disposition: A | Discharge status: Home / Self Care | Payer: Medicare HMO | Source: Ambulatory Visit | Attending: Radiation Oncology | Admitting: Radiation Oncology

## 2013-07-20 VITALS — BP 127/78 | HR 44 | Temp 98.3°F | Resp 20

## 2013-07-20 DIAGNOSIS — C61 Malignant neoplasm of prostate: Secondary | ICD-10-CM

## 2013-07-20 NOTE — Progress Notes (Signed)
Was taken to the CT simulator. He was placed supine. His pelvis was scanned. The CT data set was sent to the [facility] system for contouring of his prostate and rectum. He'll then undergo 3-D simulation to assess the quality of his implant.

## 2013-07-20 NOTE — Progress Notes (Signed)
Patient denies pain, fatigue, loss of apetite, bowel issues. He reports dysuria, nocturia that varies in amount and urgency with voiding at times. He states the dysuria is improving. Pt had Foley cath placed following seed implant; cath was removed on 07/04/13.

## 2013-07-20 NOTE — Progress Notes (Signed)
CC: Dr. Franchot Gallo  Followup note: Darin Williamson returns today approximately 3 weeks following his prostate seed implant with Dr. Diona Fanti in the management of his stage TI C. intermediate risk adenocarcinoma prostate. He does admit to intermittent urinary urgency with slight dysuria. He had placement of a Foley catheter following his implant and this was removed on June 9. No GI difficulties. His CT scan today for his post implant dosimetry shows what appears to be an excellent seed distribution.  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 I told that he may take ibuprofen when necessary for dysuria.  Plan: Followup visit with Dr. Diona Fanti next week in [place]. We'll move ahead with his post implant dosimetry and for the results of Dr. Diona Fanti. I expect him to do well.

## 2013-07-25 ENCOUNTER — Ambulatory Visit (INDEPENDENT_AMBULATORY_CARE_PROVIDER_SITE_OTHER): Payer: Medicare HMO | Admitting: Urology

## 2013-07-25 DIAGNOSIS — C61 Malignant neoplasm of prostate: Secondary | ICD-10-CM

## 2013-08-07 ENCOUNTER — Encounter: Payer: Self-pay | Admitting: Radiation Oncology

## 2013-08-07 DIAGNOSIS — C61 Malignant neoplasm of prostate: Secondary | ICD-10-CM | POA: Diagnosis not present

## 2013-08-07 NOTE — Progress Notes (Addendum)
Dr. Franchot Gallo   Post implant CT dosimetry/complex simulation note:  On 08/07/2013 Mr. Giese completed his post implant dosimetry to assess the quality of his implant. His intraoperative prostate volume by ultrasound was 44.7 cc and his postoperative prostate volume by CT was 45.5 cc, anexcellent correlation. Dose volume histograms were obtained for the prostate and rectum. His prostate D 90 is 117.3% and V100 96.8%, both excellent. 0 cc of rectum received the prescribed dose of 14,500 cGy. In summary, Mr. Mathieson has excellent post implant dosimetry with a low risk for late rectal toxicity.

## 2013-10-24 ENCOUNTER — Ambulatory Visit (INDEPENDENT_AMBULATORY_CARE_PROVIDER_SITE_OTHER): Payer: Medicare HMO | Admitting: Urology

## 2013-10-24 DIAGNOSIS — C61 Malignant neoplasm of prostate: Secondary | ICD-10-CM

## 2014-01-16 ENCOUNTER — Ambulatory Visit (INDEPENDENT_AMBULATORY_CARE_PROVIDER_SITE_OTHER): Payer: Medicare HMO | Admitting: Urology

## 2014-01-16 DIAGNOSIS — C61 Malignant neoplasm of prostate: Secondary | ICD-10-CM

## 2014-03-28 ENCOUNTER — Other Ambulatory Visit: Payer: Self-pay | Admitting: Neurology

## 2014-03-28 ENCOUNTER — Encounter: Payer: Self-pay | Admitting: Neurology

## 2014-03-28 ENCOUNTER — Ambulatory Visit (INDEPENDENT_AMBULATORY_CARE_PROVIDER_SITE_OTHER): Payer: PPO | Admitting: Neurology

## 2014-03-28 ENCOUNTER — Ambulatory Visit
Admission: RE | Admit: 2014-03-28 | Discharge: 2014-03-28 | Disposition: A | Discharge status: Home / Self Care | Payer: PPO | Source: Ambulatory Visit | Attending: Neurology | Admitting: Neurology

## 2014-03-28 VITALS — BP 128/74 | HR 50 | Resp 16 | Ht 69.0 in | Wt 183.0 lb

## 2014-03-28 DIAGNOSIS — G40209 Localization-related (focal) (partial) symptomatic epilepsy and epileptic syndromes with complex partial seizures, not intractable, without status epilepticus: Secondary | ICD-10-CM | POA: Diagnosis not present

## 2014-03-28 DIAGNOSIS — Z79899 Other long term (current) drug therapy: Secondary | ICD-10-CM

## 2014-03-28 MED ORDER — PHENYTOIN SODIUM EXTENDED 100 MG PO CAPS: ORAL_CAPSULE | ORAL | Status: DC
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
## 2014-03-28 NOTE — Patient Instructions (Signed)
1. Check vitamin D level 2. Bone density scan 3. Continue Dilantin 100mg : Take 2 caps in AM, 1 cap in PM 4. Follow-up in 1 year

## 2014-03-29 LAB — VITAMIN D 25 HYDROXY (VIT D DEFICIENCY, FRACTURES): VIT D 25 HYDROXY: 8 ng/mL — AB (ref 30–100)

## 2014-04-02 ENCOUNTER — Encounter: Payer: Self-pay | Admitting: Neurology

## 2014-04-03 ENCOUNTER — Encounter: Payer: Self-pay | Admitting: Family Medicine

## 2014-04-03 ENCOUNTER — Telehealth: Payer: Self-pay | Admitting: Family Medicine

## 2014-04-03 MED ORDER — VITAMIN D (ERGOCALCIFEROL) 1.25 MG (50000 UNIT) PO CAPS: ORAL_CAPSULE | ORAL | Status: DC

## 2014-04-03 NOTE — Telephone Encounter (Signed)
-----   Message from Cameron Sprang, MD sent at 03/29/2014  9:48 AM EST ----- Pls let him know his vitamin D level is low. He needs to start replacement, start vitamin D2 50,000 units once a week for 8 weeks, then after this he should start daily calcium 600mg +vitamin D 500 IU supplements. Pls send Rx and forward to PCP for fyi as well. Thanks

## 2014-04-03 NOTE — Telephone Encounter (Signed)
Patient was notified of results & adviesment. Rx sent to patient's pharmacy. I will also send instructions for prescription/supplement to patient in the mail.

## 2014-04-06 ENCOUNTER — Other Ambulatory Visit: Payer: Self-pay | Admitting: *Deleted

## 2014-04-06 DIAGNOSIS — G40209 Localization-related (focal) (partial) symptomatic epilepsy and epileptic syndromes with complex partial seizures, not intractable, without status epilepticus: Secondary | ICD-10-CM

## 2014-04-06 NOTE — Telephone Encounter (Signed)
Patient requesting a refill 

## 2014-04-12 ENCOUNTER — Telehealth: Payer: Self-pay | Admitting: Neurology

## 2014-04-12 NOTE — Telephone Encounter (Signed)
Pt needs his Dilantin called into [facility] in [place].  Pt phone number is [phone]

## 2014-04-12 NOTE — Telephone Encounter (Signed)
I called patient told him to call his pharmacy Rx with refills was sent in on 3/2.

## 2014-04-24 ENCOUNTER — Telehealth: Payer: Self-pay | Admitting: Family Medicine

## 2014-04-24 NOTE — Telephone Encounter (Signed)
Lmom to return my call. 

## 2014-04-24 NOTE — Telephone Encounter (Signed)
-----   Message from Cameron Sprang, MD sent at 04/24/2014 12:24 PM EDT ----- Regarding: bone density scan Pls let him know bone density scan showed slight bone loss at the hip, otherwise normal. Would make sure he continues with Vitamin D and calcium. Thanks

## 2014-04-25 NOTE — Telephone Encounter (Signed)
Patient returned my call. Notified him of below results.

## 2014-05-15 ENCOUNTER — Ambulatory Visit (INDEPENDENT_AMBULATORY_CARE_PROVIDER_SITE_OTHER): Payer: PPO | Admitting: Urology

## 2014-05-15 DIAGNOSIS — C61 Malignant neoplasm of prostate: Secondary | ICD-10-CM | POA: Diagnosis not present

## 2014-11-06 ENCOUNTER — Ambulatory Visit (INDEPENDENT_AMBULATORY_CARE_PROVIDER_SITE_OTHER): Payer: PPO | Admitting: Urology

## 2014-11-06 DIAGNOSIS — C61 Malignant neoplasm of prostate: Secondary | ICD-10-CM | POA: Diagnosis not present

## 2014-12-17 ENCOUNTER — Emergency Department (HOSPITAL_COMMUNITY)
Admission: EM | Admit: 2014-12-17 | Discharge: 2014-12-17 | Disposition: A | Discharge status: Home / Self Care | Payer: PPO | Attending: Emergency Medicine | Admitting: Emergency Medicine

## 2014-12-17 ENCOUNTER — Emergency Department (HOSPITAL_COMMUNITY): Payer: PPO

## 2014-12-17 ENCOUNTER — Encounter (HOSPITAL_COMMUNITY): Payer: Self-pay | Admitting: Emergency Medicine

## 2014-12-17 DIAGNOSIS — G40909 Epilepsy, unspecified, not intractable, without status epilepticus: Secondary | ICD-10-CM | POA: Insufficient documentation

## 2014-12-17 DIAGNOSIS — J40 Bronchitis, not specified as acute or chronic: Secondary | ICD-10-CM | POA: Diagnosis not present

## 2014-12-17 DIAGNOSIS — Z973 Presence of spectacles and contact lenses: Secondary | ICD-10-CM | POA: Insufficient documentation

## 2014-12-17 DIAGNOSIS — R05 Cough: Secondary | ICD-10-CM | POA: Diagnosis present

## 2014-12-17 DIAGNOSIS — Z8739 Personal history of other diseases of the musculoskeletal system and connective tissue: Secondary | ICD-10-CM | POA: Diagnosis not present

## 2014-12-17 DIAGNOSIS — Z79899 Other long term (current) drug therapy: Secondary | ICD-10-CM | POA: Diagnosis not present

## 2014-12-17 DIAGNOSIS — Z87891 Personal history of nicotine dependence: Secondary | ICD-10-CM | POA: Insufficient documentation

## 2014-12-17 DIAGNOSIS — J069 Acute upper respiratory infection, unspecified: Secondary | ICD-10-CM

## 2014-12-17 DIAGNOSIS — Z87828 Personal history of other (healed) physical injury and trauma: Secondary | ICD-10-CM | POA: Insufficient documentation

## 2014-12-17 DIAGNOSIS — Z8546 Personal history of malignant neoplasm of prostate: Secondary | ICD-10-CM | POA: Insufficient documentation

## 2014-12-17 MED ORDER — HYDROCODONE-HOMATROPINE 5-1.5 MG/5ML PO SYRP: 5.0000 mL | ORAL_SOLUTION | Freq: Four times a day (QID) | ORAL | Status: DC | PRN

## 2014-12-17 MED ORDER — LORATADINE-PSEUDOEPHEDRINE ER 5-120 MG PO TB12: 1.0000 | ORAL_TABLET | Freq: Two times a day (BID) | ORAL | Status: DC
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
## 2014-12-17 NOTE — Discharge Instructions (Signed)

## 2014-12-17 NOTE — ED Notes (Signed)
Seen about one month ago and given medication for allergy.  C/o cough and nasal congestion. Denies fever.

## 2015-01-30 DIAGNOSIS — M1711 Unilateral primary osteoarthritis, right knee: Secondary | ICD-10-CM | POA: Diagnosis not present

## 2015-02-06 DIAGNOSIS — J209 Acute bronchitis, unspecified: Secondary | ICD-10-CM | POA: Diagnosis not present

## 2015-02-06 DIAGNOSIS — Z1389 Encounter for screening for other disorder: Secondary | ICD-10-CM | POA: Diagnosis not present

## 2015-02-06 DIAGNOSIS — Z6828 Body mass index (BMI) 28.0-28.9, adult: Secondary | ICD-10-CM | POA: Diagnosis not present

## 2015-02-06 DIAGNOSIS — E663 Overweight: Secondary | ICD-10-CM | POA: Diagnosis not present

## 2015-02-06 DIAGNOSIS — J019 Acute sinusitis, unspecified: Secondary | ICD-10-CM | POA: Diagnosis not present

## 2015-04-09 ENCOUNTER — Other Ambulatory Visit: Payer: Self-pay | Admitting: Neurology

## 2015-04-27 DIAGNOSIS — C61 Malignant neoplasm of prostate: Secondary | ICD-10-CM | POA: Diagnosis not present

## 2015-04-30 ENCOUNTER — Ambulatory Visit (INDEPENDENT_AMBULATORY_CARE_PROVIDER_SITE_OTHER): Payer: PPO | Admitting: Urology

## 2015-04-30 DIAGNOSIS — C61 Malignant neoplasm of prostate: Secondary | ICD-10-CM | POA: Diagnosis not present

## 2015-05-13 DIAGNOSIS — Z0000 Encounter for general adult medical examination without abnormal findings: Secondary | ICD-10-CM | POA: Diagnosis not present

## 2015-05-13 DIAGNOSIS — E663 Overweight: Secondary | ICD-10-CM | POA: Diagnosis not present

## 2015-05-13 DIAGNOSIS — Z6828 Body mass index (BMI) 28.0-28.9, adult: Secondary | ICD-10-CM | POA: Diagnosis not present

## 2015-06-03 ENCOUNTER — Other Ambulatory Visit: Payer: Self-pay | Admitting: Neurology

## 2015-06-07 ENCOUNTER — Other Ambulatory Visit: Payer: Self-pay | Admitting: Neurology

## 2015-06-07 NOTE — Telephone Encounter (Signed)
Pt called for refill on Dilantin 100 mg. Pt has not had an appointment since 03/28/14, at that time Dr. Delice Lesch requested a 1 year f/u. Pt was given a refill in March, with a note attached that he needed to have an office visit for further refills. Pt did not schedule. He did schedule today with me, while we were on the phone. Pt scheduled for August w/ Dr. Delice Lesch.  Informed pt that I would see if any providers would authorize a refill or not. Pt was encouraged to speak to PCP about refills, as he was on his way there currently anyway. Please advise.

## 2015-06-07 NOTE — Telephone Encounter (Signed)
Pt called and is almost out of his Dilantin 100mg  (couple pills left).  Would like it called into the pharm we have on file.

## 2015-06-10 DIAGNOSIS — Z1389 Encounter for screening for other disorder: Secondary | ICD-10-CM | POA: Diagnosis not present

## 2015-06-10 DIAGNOSIS — G40909 Epilepsy, unspecified, not intractable, without status epilepticus: Secondary | ICD-10-CM | POA: Diagnosis not present

## 2015-06-10 DIAGNOSIS — Z6827 Body mass index (BMI) 27.0-27.9, adult: Secondary | ICD-10-CM | POA: Diagnosis not present

## 2015-06-25 DIAGNOSIS — Z1389 Encounter for screening for other disorder: Secondary | ICD-10-CM | POA: Diagnosis not present

## 2015-06-25 DIAGNOSIS — E663 Overweight: Secondary | ICD-10-CM | POA: Diagnosis not present

## 2015-06-25 DIAGNOSIS — Z6827 Body mass index (BMI) 27.0-27.9, adult: Secondary | ICD-10-CM | POA: Diagnosis not present

## 2015-06-25 DIAGNOSIS — M5412 Radiculopathy, cervical region: Secondary | ICD-10-CM | POA: Diagnosis not present

## 2015-06-25 DIAGNOSIS — M542 Cervicalgia: Secondary | ICD-10-CM | POA: Diagnosis not present

## 2015-08-28 ENCOUNTER — Ambulatory Visit: Payer: PPO | Admitting: Neurology

## 2015-08-28 DIAGNOSIS — Z029 Encounter for administrative examinations, unspecified: Secondary | ICD-10-CM

## 2015-10-17 DIAGNOSIS — Z6827 Body mass index (BMI) 27.0-27.9, adult: Secondary | ICD-10-CM | POA: Diagnosis not present

## 2015-10-17 DIAGNOSIS — E663 Overweight: Secondary | ICD-10-CM | POA: Diagnosis not present

## 2015-10-17 DIAGNOSIS — C61 Malignant neoplasm of prostate: Secondary | ICD-10-CM | POA: Diagnosis not present

## 2015-10-17 DIAGNOSIS — M1991 Primary osteoarthritis, unspecified site: Secondary | ICD-10-CM | POA: Diagnosis not present

## 2015-10-17 DIAGNOSIS — J0181 Other acute recurrent sinusitis: Secondary | ICD-10-CM | POA: Diagnosis not present

## 2015-10-31 DIAGNOSIS — H47012 Ischemic optic neuropathy, left eye: Secondary | ICD-10-CM | POA: Diagnosis not present

## 2015-11-02 DIAGNOSIS — C61 Malignant neoplasm of prostate: Secondary | ICD-10-CM | POA: Diagnosis not present

## 2015-11-05 ENCOUNTER — Ambulatory Visit (INDEPENDENT_AMBULATORY_CARE_PROVIDER_SITE_OTHER): Payer: PPO | Admitting: Urology

## 2015-11-05 DIAGNOSIS — C61 Malignant neoplasm of prostate: Secondary | ICD-10-CM | POA: Diagnosis not present

## 2015-12-12 DIAGNOSIS — M25562 Pain in left knee: Secondary | ICD-10-CM | POA: Diagnosis not present

## 2015-12-12 DIAGNOSIS — M25561 Pain in right knee: Secondary | ICD-10-CM | POA: Diagnosis not present

## 2015-12-12 DIAGNOSIS — M170 Bilateral primary osteoarthritis of knee: Secondary | ICD-10-CM | POA: Diagnosis not present

## 2015-12-24 DIAGNOSIS — R262 Difficulty in walking, not elsewhere classified: Secondary | ICD-10-CM | POA: Diagnosis not present

## 2015-12-24 DIAGNOSIS — M25561 Pain in right knee: Secondary | ICD-10-CM | POA: Diagnosis not present

## 2015-12-24 DIAGNOSIS — M1711 Unilateral primary osteoarthritis, right knee: Secondary | ICD-10-CM | POA: Diagnosis not present

## 2015-12-31 DIAGNOSIS — M25561 Pain in right knee: Secondary | ICD-10-CM | POA: Diagnosis not present

## 2015-12-31 DIAGNOSIS — M1711 Unilateral primary osteoarthritis, right knee: Secondary | ICD-10-CM | POA: Diagnosis not present

## 2016-01-07 DIAGNOSIS — M1711 Unilateral primary osteoarthritis, right knee: Secondary | ICD-10-CM | POA: Diagnosis not present

## 2016-01-07 DIAGNOSIS — M25561 Pain in right knee: Secondary | ICD-10-CM | POA: Diagnosis not present

## 2016-01-15 DIAGNOSIS — M1711 Unilateral primary osteoarthritis, right knee: Secondary | ICD-10-CM | POA: Diagnosis not present

## 2016-01-15 DIAGNOSIS — M25561 Pain in right knee: Secondary | ICD-10-CM | POA: Diagnosis not present

## 2016-01-22 DIAGNOSIS — M1711 Unilateral primary osteoarthritis, right knee: Secondary | ICD-10-CM | POA: Diagnosis not present

## 2016-01-22 DIAGNOSIS — M25561 Pain in right knee: Secondary | ICD-10-CM | POA: Diagnosis not present

## 2016-03-23 ENCOUNTER — Ambulatory Visit (HOSPITAL_COMMUNITY)
Admission: RE | Admit: 2016-03-23 | Discharge: 2016-03-23 | Disposition: A | Discharge status: Home / Self Care | Payer: PPO | Source: Ambulatory Visit | Attending: Internal Medicine | Admitting: Internal Medicine

## 2016-03-23 ENCOUNTER — Other Ambulatory Visit (HOSPITAL_COMMUNITY): Payer: Self-pay | Admitting: Internal Medicine

## 2016-03-23 DIAGNOSIS — J069 Acute upper respiratory infection, unspecified: Secondary | ICD-10-CM | POA: Diagnosis not present

## 2016-03-23 DIAGNOSIS — R05 Cough: Secondary | ICD-10-CM | POA: Diagnosis not present

## 2016-03-23 DIAGNOSIS — Z6827 Body mass index (BMI) 27.0-27.9, adult: Secondary | ICD-10-CM | POA: Diagnosis not present

## 2016-03-23 DIAGNOSIS — J209 Acute bronchitis, unspecified: Secondary | ICD-10-CM | POA: Insufficient documentation

## 2016-03-23 DIAGNOSIS — Z1389 Encounter for screening for other disorder: Secondary | ICD-10-CM | POA: Diagnosis not present

## 2016-03-23 DIAGNOSIS — E663 Overweight: Secondary | ICD-10-CM | POA: Diagnosis not present

## 2016-03-30 DIAGNOSIS — J329 Chronic sinusitis, unspecified: Secondary | ICD-10-CM | POA: Diagnosis not present

## 2016-03-30 DIAGNOSIS — Z6827 Body mass index (BMI) 27.0-27.9, adult: Secondary | ICD-10-CM | POA: Diagnosis not present

## 2016-03-30 DIAGNOSIS — R05 Cough: Secondary | ICD-10-CM | POA: Diagnosis not present

## 2016-03-30 DIAGNOSIS — J209 Acute bronchitis, unspecified: Secondary | ICD-10-CM | POA: Diagnosis not present

## 2016-03-30 DIAGNOSIS — E663 Overweight: Secondary | ICD-10-CM | POA: Diagnosis not present

## 2016-03-30 DIAGNOSIS — Z1389 Encounter for screening for other disorder: Secondary | ICD-10-CM | POA: Diagnosis not present

## 2016-04-09 DIAGNOSIS — Z6827 Body mass index (BMI) 27.0-27.9, adult: Secondary | ICD-10-CM | POA: Diagnosis not present

## 2016-04-09 DIAGNOSIS — J302 Other seasonal allergic rhinitis: Secondary | ICD-10-CM | POA: Diagnosis not present

## 2016-04-09 DIAGNOSIS — Z1389 Encounter for screening for other disorder: Secondary | ICD-10-CM | POA: Diagnosis not present

## 2016-04-24 DIAGNOSIS — C61 Malignant neoplasm of prostate: Secondary | ICD-10-CM | POA: Diagnosis not present

## 2016-04-28 ENCOUNTER — Ambulatory Visit (INDEPENDENT_AMBULATORY_CARE_PROVIDER_SITE_OTHER): Payer: PPO | Admitting: Urology

## 2016-04-28 DIAGNOSIS — C61 Malignant neoplasm of prostate: Secondary | ICD-10-CM | POA: Diagnosis not present

## 2016-07-27 DIAGNOSIS — Z6829 Body mass index (BMI) 29.0-29.9, adult: Secondary | ICD-10-CM | POA: Diagnosis not present

## 2016-07-27 DIAGNOSIS — Z1389 Encounter for screening for other disorder: Secondary | ICD-10-CM | POA: Diagnosis not present

## 2016-07-27 DIAGNOSIS — Z0000 Encounter for general adult medical examination without abnormal findings: Secondary | ICD-10-CM | POA: Diagnosis not present

## 2016-07-27 DIAGNOSIS — Z23 Encounter for immunization: Secondary | ICD-10-CM | POA: Diagnosis not present

## 2016-07-31 DIAGNOSIS — M159 Polyosteoarthritis, unspecified: Secondary | ICD-10-CM | POA: Diagnosis not present

## 2016-07-31 DIAGNOSIS — E663 Overweight: Secondary | ICD-10-CM | POA: Diagnosis not present

## 2016-07-31 DIAGNOSIS — Z8673 Personal history of transient ischemic attack (TIA), and cerebral infarction without residual deficits: Secondary | ICD-10-CM | POA: Diagnosis not present

## 2016-07-31 DIAGNOSIS — M1991 Primary osteoarthritis, unspecified site: Secondary | ICD-10-CM | POA: Diagnosis not present

## 2016-07-31 DIAGNOSIS — E782 Mixed hyperlipidemia: Secondary | ICD-10-CM | POA: Diagnosis not present

## 2016-07-31 DIAGNOSIS — G40909 Epilepsy, unspecified, not intractable, without status epilepticus: Secondary | ICD-10-CM | POA: Diagnosis not present

## 2016-07-31 DIAGNOSIS — C61 Malignant neoplasm of prostate: Secondary | ICD-10-CM | POA: Diagnosis not present

## 2016-07-31 DIAGNOSIS — Z6829 Body mass index (BMI) 29.0-29.9, adult: Secondary | ICD-10-CM | POA: Diagnosis not present

## 2016-10-15 DIAGNOSIS — Z23 Encounter for immunization: Secondary | ICD-10-CM | POA: Diagnosis not present

## 2016-10-30 DIAGNOSIS — C61 Malignant neoplasm of prostate: Secondary | ICD-10-CM | POA: Diagnosis not present

## 2016-11-03 ENCOUNTER — Ambulatory Visit (INDEPENDENT_AMBULATORY_CARE_PROVIDER_SITE_OTHER): Payer: PPO | Admitting: Urology

## 2016-11-03 DIAGNOSIS — C61 Malignant neoplasm of prostate: Secondary | ICD-10-CM | POA: Diagnosis not present

## 2017-06-02 DIAGNOSIS — G40909 Epilepsy, unspecified, not intractable, without status epilepticus: Secondary | ICD-10-CM | POA: Diagnosis not present

## 2017-06-02 DIAGNOSIS — E663 Overweight: Secondary | ICD-10-CM | POA: Diagnosis not present

## 2017-06-02 DIAGNOSIS — Z6829 Body mass index (BMI) 29.0-29.9, adult: Secondary | ICD-10-CM | POA: Diagnosis not present

## 2017-06-02 DIAGNOSIS — E039 Hypothyroidism, unspecified: Secondary | ICD-10-CM | POA: Diagnosis not present

## 2017-06-02 DIAGNOSIS — Z1389 Encounter for screening for other disorder: Secondary | ICD-10-CM | POA: Diagnosis not present

## 2017-08-04 DIAGNOSIS — Z0001 Encounter for general adult medical examination with abnormal findings: Secondary | ICD-10-CM | POA: Diagnosis not present

## 2017-08-04 DIAGNOSIS — E063 Autoimmune thyroiditis: Secondary | ICD-10-CM | POA: Diagnosis not present

## 2017-08-04 DIAGNOSIS — M1991 Primary osteoarthritis, unspecified site: Secondary | ICD-10-CM | POA: Diagnosis not present

## 2017-08-04 DIAGNOSIS — C61 Malignant neoplasm of prostate: Secondary | ICD-10-CM | POA: Diagnosis not present

## 2017-08-04 DIAGNOSIS — S29019A Strain of muscle and tendon of unspecified wall of thorax, initial encounter: Secondary | ICD-10-CM | POA: Diagnosis not present

## 2017-08-04 DIAGNOSIS — S39012A Strain of muscle, fascia and tendon of lower back, initial encounter: Secondary | ICD-10-CM | POA: Diagnosis not present

## 2017-08-04 DIAGNOSIS — E785 Hyperlipidemia, unspecified: Secondary | ICD-10-CM | POA: Diagnosis not present

## 2017-08-04 DIAGNOSIS — E782 Mixed hyperlipidemia: Secondary | ICD-10-CM | POA: Diagnosis not present

## 2017-08-04 DIAGNOSIS — Z6827 Body mass index (BMI) 27.0-27.9, adult: Secondary | ICD-10-CM | POA: Diagnosis not present

## 2017-08-04 DIAGNOSIS — E039 Hypothyroidism, unspecified: Secondary | ICD-10-CM | POA: Diagnosis not present

## 2017-08-13 DIAGNOSIS — H40013 Open angle with borderline findings, low risk, bilateral: Secondary | ICD-10-CM | POA: Diagnosis not present

## 2017-08-13 DIAGNOSIS — H25813 Combined forms of age-related cataract, bilateral: Secondary | ICD-10-CM | POA: Diagnosis not present

## 2017-11-05 DIAGNOSIS — C61 Malignant neoplasm of prostate: Secondary | ICD-10-CM | POA: Diagnosis not present

## 2017-11-09 ENCOUNTER — Ambulatory Visit (INDEPENDENT_AMBULATORY_CARE_PROVIDER_SITE_OTHER): Payer: PPO | Admitting: Urology

## 2017-11-09 DIAGNOSIS — Z23 Encounter for immunization: Secondary | ICD-10-CM | POA: Diagnosis not present

## 2017-11-09 DIAGNOSIS — Z8546 Personal history of malignant neoplasm of prostate: Secondary | ICD-10-CM

## 2017-11-15 DIAGNOSIS — H25813 Combined forms of age-related cataract, bilateral: Secondary | ICD-10-CM | POA: Diagnosis not present

## 2017-11-15 DIAGNOSIS — H40013 Open angle with borderline findings, low risk, bilateral: Secondary | ICD-10-CM | POA: Diagnosis not present

## 2017-12-21 DIAGNOSIS — E063 Autoimmune thyroiditis: Secondary | ICD-10-CM | POA: Diagnosis not present

## 2017-12-21 DIAGNOSIS — E663 Overweight: Secondary | ICD-10-CM | POA: Diagnosis not present

## 2017-12-21 DIAGNOSIS — Z6828 Body mass index (BMI) 28.0-28.9, adult: Secondary | ICD-10-CM | POA: Diagnosis not present

## 2017-12-21 DIAGNOSIS — Z1389 Encounter for screening for other disorder: Secondary | ICD-10-CM | POA: Diagnosis not present

## 2017-12-21 DIAGNOSIS — J329 Chronic sinusitis, unspecified: Secondary | ICD-10-CM | POA: Diagnosis not present

## 2018-02-15 DIAGNOSIS — H25813 Combined forms of age-related cataract, bilateral: Secondary | ICD-10-CM | POA: Diagnosis not present

## 2018-02-15 DIAGNOSIS — H40013 Open angle with borderline findings, low risk, bilateral: Secondary | ICD-10-CM | POA: Diagnosis not present

## 2018-06-09 DIAGNOSIS — Z6829 Body mass index (BMI) 29.0-29.9, adult: Secondary | ICD-10-CM | POA: Diagnosis not present

## 2018-06-09 DIAGNOSIS — I10 Essential (primary) hypertension: Secondary | ICD-10-CM | POA: Diagnosis not present

## 2018-06-09 DIAGNOSIS — G40909 Epilepsy, unspecified, not intractable, without status epilepticus: Secondary | ICD-10-CM | POA: Diagnosis not present

## 2018-06-09 DIAGNOSIS — Z1389 Encounter for screening for other disorder: Secondary | ICD-10-CM | POA: Diagnosis not present

## 2018-06-09 DIAGNOSIS — E663 Overweight: Secondary | ICD-10-CM | POA: Diagnosis not present

## 2018-06-09 DIAGNOSIS — E063 Autoimmune thyroiditis: Secondary | ICD-10-CM | POA: Diagnosis not present

## 2018-06-09 DIAGNOSIS — M1991 Primary osteoarthritis, unspecified site: Secondary | ICD-10-CM | POA: Diagnosis not present

## 2018-06-09 DIAGNOSIS — C61 Malignant neoplasm of prostate: Secondary | ICD-10-CM | POA: Diagnosis not present

## 2018-07-07 DIAGNOSIS — Z1389 Encounter for screening for other disorder: Secondary | ICD-10-CM | POA: Diagnosis not present

## 2018-07-07 DIAGNOSIS — E781 Pure hyperglyceridemia: Secondary | ICD-10-CM | POA: Diagnosis not present

## 2018-07-07 DIAGNOSIS — Z0001 Encounter for general adult medical examination with abnormal findings: Secondary | ICD-10-CM | POA: Diagnosis not present

## 2018-07-07 DIAGNOSIS — E782 Mixed hyperlipidemia: Secondary | ICD-10-CM | POA: Diagnosis not present

## 2018-07-07 DIAGNOSIS — M159 Polyosteoarthritis, unspecified: Secondary | ICD-10-CM | POA: Diagnosis not present

## 2018-07-07 DIAGNOSIS — Z6829 Body mass index (BMI) 29.0-29.9, adult: Secondary | ICD-10-CM | POA: Diagnosis not present

## 2018-07-07 DIAGNOSIS — G40909 Epilepsy, unspecified, not intractable, without status epilepticus: Secondary | ICD-10-CM | POA: Diagnosis not present

## 2018-07-07 DIAGNOSIS — E663 Overweight: Secondary | ICD-10-CM | POA: Diagnosis not present

## 2018-07-07 DIAGNOSIS — E039 Hypothyroidism, unspecified: Secondary | ICD-10-CM | POA: Diagnosis not present

## 2018-07-07 DIAGNOSIS — I10 Essential (primary) hypertension: Secondary | ICD-10-CM | POA: Diagnosis not present

## 2018-07-07 DIAGNOSIS — C61 Malignant neoplasm of prostate: Secondary | ICD-10-CM | POA: Diagnosis not present

## 2018-07-07 DIAGNOSIS — M1991 Primary osteoarthritis, unspecified site: Secondary | ICD-10-CM | POA: Diagnosis not present

## 2018-07-07 DIAGNOSIS — E063 Autoimmune thyroiditis: Secondary | ICD-10-CM | POA: Diagnosis not present

## 2018-08-01 DIAGNOSIS — Z6830 Body mass index (BMI) 30.0-30.9, adult: Secondary | ICD-10-CM | POA: Diagnosis not present

## 2018-08-01 DIAGNOSIS — T07XXXA Unspecified multiple injuries, initial encounter: Secondary | ICD-10-CM | POA: Diagnosis not present

## 2018-08-01 DIAGNOSIS — W57XXXA Bitten or stung by nonvenomous insect and other nonvenomous arthropods, initial encounter: Secondary | ICD-10-CM | POA: Diagnosis not present

## 2018-08-01 DIAGNOSIS — E6609 Other obesity due to excess calories: Secondary | ICD-10-CM | POA: Diagnosis not present

## 2018-08-02 DIAGNOSIS — E663 Overweight: Secondary | ICD-10-CM | POA: Diagnosis not present

## 2018-08-02 DIAGNOSIS — Z6829 Body mass index (BMI) 29.0-29.9, adult: Secondary | ICD-10-CM | POA: Diagnosis not present

## 2018-08-02 DIAGNOSIS — T07XXXA Unspecified multiple injuries, initial encounter: Secondary | ICD-10-CM | POA: Diagnosis not present

## 2018-08-16 DIAGNOSIS — H40013 Open angle with borderline findings, low risk, bilateral: Secondary | ICD-10-CM | POA: Diagnosis not present

## 2018-08-16 DIAGNOSIS — H25813 Combined forms of age-related cataract, bilateral: Secondary | ICD-10-CM | POA: Diagnosis not present

## 2018-09-22 DIAGNOSIS — Z6828 Body mass index (BMI) 28.0-28.9, adult: Secondary | ICD-10-CM | POA: Diagnosis not present

## 2018-09-22 DIAGNOSIS — E063 Autoimmune thyroiditis: Secondary | ICD-10-CM | POA: Diagnosis not present

## 2018-09-22 DIAGNOSIS — E663 Overweight: Secondary | ICD-10-CM | POA: Diagnosis not present

## 2018-09-22 DIAGNOSIS — M1991 Primary osteoarthritis, unspecified site: Secondary | ICD-10-CM | POA: Diagnosis not present

## 2018-09-22 DIAGNOSIS — I10 Essential (primary) hypertension: Secondary | ICD-10-CM | POA: Diagnosis not present

## 2018-09-22 DIAGNOSIS — M545 Low back pain: Secondary | ICD-10-CM | POA: Diagnosis not present

## 2018-11-02 DIAGNOSIS — Z23 Encounter for immunization: Secondary | ICD-10-CM | POA: Diagnosis not present

## 2018-12-02 DIAGNOSIS — Z8546 Personal history of malignant neoplasm of prostate: Secondary | ICD-10-CM | POA: Diagnosis not present

## 2018-12-02 DIAGNOSIS — C61 Malignant neoplasm of prostate: Secondary | ICD-10-CM | POA: Diagnosis not present

## 2018-12-06 ENCOUNTER — Ambulatory Visit (INDEPENDENT_AMBULATORY_CARE_PROVIDER_SITE_OTHER): Payer: PPO | Admitting: Urology

## 2018-12-06 DIAGNOSIS — Z8546 Personal history of malignant neoplasm of prostate: Secondary | ICD-10-CM

## 2019-01-04 DIAGNOSIS — H25813 Combined forms of age-related cataract, bilateral: Secondary | ICD-10-CM | POA: Diagnosis not present

## 2019-01-04 DIAGNOSIS — H40013 Open angle with borderline findings, low risk, bilateral: Secondary | ICD-10-CM | POA: Diagnosis not present

## 2019-02-10 DIAGNOSIS — H25812 Combined forms of age-related cataract, left eye: Secondary | ICD-10-CM | POA: Diagnosis not present

## 2019-02-16 ENCOUNTER — Other Ambulatory Visit: Payer: Self-pay

## 2019-02-16 ENCOUNTER — Ambulatory Visit: Payer: PPO | Attending: Internal Medicine

## 2019-02-16 DIAGNOSIS — Z20822 Contact with and (suspected) exposure to covid-19: Secondary | ICD-10-CM

## 2019-02-17 LAB — NOVEL CORONAVIRUS, NAA: SARS-CoV-2, NAA: NOT DETECTED

## 2019-02-22 ENCOUNTER — Telehealth: Payer: Self-pay | Admitting: *Deleted

## 2019-02-22 NOTE — Telephone Encounter (Signed)
Called for COVID test results from 1/21, not detected was the result, pt verbalized understanding.

## 2019-03-14 ENCOUNTER — Ambulatory Visit (INDEPENDENT_AMBULATORY_CARE_PROVIDER_SITE_OTHER): Payer: PPO | Admitting: Orthopaedic Surgery

## 2019-03-14 ENCOUNTER — Other Ambulatory Visit: Payer: Self-pay

## 2019-03-14 ENCOUNTER — Ambulatory Visit (INDEPENDENT_AMBULATORY_CARE_PROVIDER_SITE_OTHER): Payer: PPO

## 2019-03-14 ENCOUNTER — Encounter: Payer: Self-pay | Admitting: Orthopaedic Surgery

## 2019-03-14 VITALS — Ht 70.0 in | Wt 194.2 lb

## 2019-03-14 DIAGNOSIS — M25561 Pain in right knee: Secondary | ICD-10-CM

## 2019-03-14 DIAGNOSIS — M1711 Unilateral primary osteoarthritis, right knee: Secondary | ICD-10-CM

## 2019-03-14 DIAGNOSIS — G8929 Other chronic pain: Secondary | ICD-10-CM
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
## 2019-03-15 DIAGNOSIS — Z0000 Encounter for general adult medical examination without abnormal findings: Secondary | ICD-10-CM | POA: Diagnosis not present

## 2019-03-15 DIAGNOSIS — E039 Hypothyroidism, unspecified: Secondary | ICD-10-CM | POA: Diagnosis not present

## 2019-03-15 DIAGNOSIS — C61 Malignant neoplasm of prostate: Secondary | ICD-10-CM | POA: Diagnosis not present

## 2019-03-15 DIAGNOSIS — M1991 Primary osteoarthritis, unspecified site: Secondary | ICD-10-CM | POA: Diagnosis not present

## 2019-03-15 DIAGNOSIS — E781 Pure hyperglyceridemia: Secondary | ICD-10-CM | POA: Diagnosis not present

## 2019-03-15 DIAGNOSIS — G40909 Epilepsy, unspecified, not intractable, without status epilepticus: Secondary | ICD-10-CM | POA: Diagnosis not present

## 2019-03-15 DIAGNOSIS — E663 Overweight: Secondary | ICD-10-CM | POA: Diagnosis not present

## 2019-03-15 DIAGNOSIS — M159 Polyosteoarthritis, unspecified: Secondary | ICD-10-CM | POA: Diagnosis not present

## 2019-03-15 DIAGNOSIS — I10 Essential (primary) hypertension: Secondary | ICD-10-CM | POA: Diagnosis not present

## 2019-03-15 DIAGNOSIS — E063 Autoimmune thyroiditis: Secondary | ICD-10-CM | POA: Diagnosis not present

## 2019-03-15 DIAGNOSIS — Z6829 Body mass index (BMI) 29.0-29.9, adult: Secondary | ICD-10-CM | POA: Diagnosis not present

## 2019-03-15 DIAGNOSIS — Z0001 Encounter for general adult medical examination with abnormal findings: Secondary | ICD-10-CM | POA: Diagnosis not present

## 2019-03-15 DIAGNOSIS — E782 Mixed hyperlipidemia: Secondary | ICD-10-CM | POA: Diagnosis not present

## 2019-03-15 DIAGNOSIS — Z125 Encounter for screening for malignant neoplasm of prostate: Secondary | ICD-10-CM | POA: Diagnosis not present

## 2019-03-22 DIAGNOSIS — H52203 Unspecified astigmatism, bilateral: Secondary | ICD-10-CM | POA: Diagnosis not present

## 2019-03-22 DIAGNOSIS — Z961 Presence of intraocular lens: Secondary | ICD-10-CM | POA: Diagnosis not present

## 2019-03-22 DIAGNOSIS — H524 Presbyopia: Secondary | ICD-10-CM | POA: Diagnosis not present

## 2019-03-22 DIAGNOSIS — H5213 Myopia, bilateral: Secondary | ICD-10-CM | POA: Diagnosis not present

## 2019-03-27 DIAGNOSIS — Z961 Presence of intraocular lens: Secondary | ICD-10-CM | POA: Diagnosis not present

## 2019-03-28 ENCOUNTER — Telehealth: Payer: Self-pay | Admitting: Orthopaedic Surgery

## 2019-03-28 NOTE — Telephone Encounter (Signed)
thanks

## 2019-03-28 NOTE — Telephone Encounter (Signed)
FYI

## 2019-03-28 NOTE — Telephone Encounter (Signed)
Left a voice mail message on patient's home phone [phone]) requesting he call me to discuss a surgery date for his right total knee. My name and direct message was provided in voice mail today.  I had attempted to reach patient on his cell phone ([phone])after receiving clearance from Dr. Gerarda Fraction, but voice mail was not available and phone continued to ring.

## 2019-04-03 ENCOUNTER — Telehealth: Payer: Self-pay

## 2019-04-03 NOTE — Patient Instructions (Addendum)
DUE TO COVID-19 ONLY ONE VISITOR IS ALLOWED TO COME WITH YOU AND STAY IN THE WAITING ROOM ONLY DURING PRE OP AND PROCEDURE DAY OF Williamson. THE 1 VISITOR MAY VISIT WITH YOU AFTER Williamson IN YOUR PRIVATE ROOM DURING VISITING HOURS ONLY!  YOU NEED TO HAVE A COVID 19 TEST ON__3/12_____ @2 :20_______, THIS TEST MUST BE DONE BEFORE Williamson, COME  Darin Williamson , [id number].  ([place]) ONCE YOUR COVID TEST IS COMPLETED, PLEASE BEGIN THE QUARANTINE INSTRUCTIONS AS OUTLINED IN YOUR HANDOUT.                Darin Williamson    Your procedure is scheduled on: 04/11/19   Report to Darin Williamson   Report to Short Stay at 5:30 AM     Call this number if you have problems the morning of Williamson Darin Williamson, NO CHEWING GUM [place].  Do not eat food After Midnight.   YOU MAY HAVE CLEAR LIQUIDS FROM MIDNIGHT UNTIL 4:30 AM.   At 4:30 AM Please finish the prescribed Pre-Williamson  drink  . Nothing by mouth after you finish the  drink !    Take these medicines the morning of Williamson with A SIP OF WATER:  Dilantin, Amlodipine, Oxybutynin                                 You may not have any metal on your body including               piercings  Do not wear jewelry,  lotions, powders or deodorant                       Men may shave face and neck.   Do not bring valuables to the hospital. [place].  Contacts, dentures or bridgework may not be worn into Williamson.      Patients discharged the day of Williamson will not be allowed to drive home.   IF YOU ARE HAVING Williamson AND GOING HOME THE SAME DAY, YOU MUST HAVE AN ADULT TO DRIVE YOU HOME AND BE WITH YOU FOR 24 HOURS. YOU MAY GO HOME BY TAXI OR UBER OR ORTHERWISE, BUT AN ADULT MUST ACCOMPANY YOU HOME AND STAY WITH YOU FOR 24 HOURS.  Name and phone number of your driver:  Special  Instructions: N/A              Please read over the following fact sheets you were given: _____________________________________________________________________             Darin Williamson Before Williamson, you can play an important role.   Because skin is not sterile, your skin needs to be as free of germs as possible.   You can reduce the number of germs on your skin by washing with CHG (chlorahexidine gluconate) soap before Williamson.   CHG is an antiseptic cleaner which kills germs and bonds with the skin to continue killing germs even after washing. Please DO NOT use if you have an allergy to CHG or antibacterial soaps.   If your skin becomes reddened/irritated stop using the CHG and inform your nurse when you arrive  at Short Stay.  You may shave your face/neck.  Please follow these instructions carefully:  1.  Shower with CHG Soap the night before Williamson and the  morning of Williamson.  2.  If you choose to wash your hair, wash your hair first as usual with your  normal  shampoo.  3.  After you shampoo, rinse your hair and body thoroughly to remove the  shampoo.                                        4.  Use CHG as you would any other liquid soap.  You can apply chg directly  to the skin and wash                       Gently with a scrungie or clean washcloth.  5.  Apply the CHG Soap to your body ONLY FROM THE NECK DOWN.   Do not use on face/ open                           Wound or open sores. Avoid contact with eyes, ears mouth and genitals (private parts).                       Wash face,  Genitals (private parts) with your normal soap.             6.  Wash thoroughly, paying special attention to the area where your Williamson  will be performed.  7.  Thoroughly rinse your body with warm water from the neck down.  8.  DO NOT shower/wash with your normal soap after using and rinsing off  the CHG Soap.             9.  Pat yourself dry with a clean towel.            10.   Wear clean pajamas.            11.  Place clean sheets on your bed the night of your first shower and do not  sleep with pets. Day of Williamson : Do not apply any lotions/deodorants the morning of Williamson.  Please wear clean clothes to the hospital/Williamson center.  FAILURE TO FOLLOW THESE INSTRUCTIONS MAY RESULT IN THE CANCELLATION OF YOUR Williamson PATIENT SIGNATURE_________________________________  NURSE SIGNATURE__________________________________  ________________________________________________________________________   Darin Williamson  An incentive spirometer is a tool that can help keep your lungs clear and active. This tool measures how well you are filling your lungs with each breath. Taking long deep breaths may help reverse or decrease the chance of developing breathing (pulmonary) problems (especially infection) following:  A long period of time when you are unable to move or be active. BEFORE THE PROCEDURE   If the spirometer includes an indicator to show your best effort, your nurse or respiratory therapist will set it to a desired goal.  If possible, sit up straight or lean slightly forward. Try not to slouch.  Hold the incentive spirometer in an upright position. INSTRUCTIONS FOR USE  1. Sit on the edge of your bed if possible, or sit up as far as you can in bed or on a chair. 2. Hold the incentive spirometer in an upright position. 3. Breathe out normally. 4. Place the mouthpiece in your mouth and seal  your lips tightly around it. 5. Breathe in slowly and as deeply as possible, raising the piston or the ball toward the top of the column. 6. Hold your breath for 3-5 seconds or for as long as possible. Allow the piston or ball to fall to the bottom of the column. 7. Remove the mouthpiece from your mouth and breathe out normally. 8. Rest for a few seconds and repeat Steps 1 through 7 at least 10 times every 1-2 hours when you are awake. Take your time and take a few  normal breaths between deep breaths. 9. The spirometer may include an indicator to show your best effort. Use the indicator as a goal to work toward during each repetition. 10. After each set of 10 deep breaths, practice coughing to be sure your lungs are clear. If you have an incision (the cut made at the time of Williamson), support your incision when coughing by placing a pillow or rolled up towels firmly against it. Once you are able to get out of bed, walk around indoors and cough well. You may stop using the incentive spirometer when instructed by your caregiver.  RISKS AND COMPLICATIONS  Take your time so you do not get dizzy or light-headed.  If you are in pain, you may need to take or ask for pain medication before doing incentive spirometry. It is harder to take a deep breath if you are having pain. AFTER USE  Rest and breathe slowly and easily.  It can be helpful to keep track of a log of your progress. Your caregiver can provide you with a simple table to help with this. If you are using the spirometer at home, follow these instructions: [place] IF:   You are having difficultly using the spirometer.  You have trouble using the spirometer as often as instructed.  Your pain medication is not giving enough relief while using the spirometer.  You develop fever of 100.5 F (38.1 C) or higher. SEEK IMMEDIATE MEDICAL CARE IF:   You cough up bloody sputum that had not been present before.  You develop fever of 102 F (38.9 C) or greater.  You develop worsening pain at or near the incision site. MAKE SURE YOU:   Understand these instructions.  Will watch your condition.  Will get help right away if you are not doing well or get worse. Document Released: 05/25/2006 Document Revised: 04/06/2011 Document Reviewed: 07/26/2006 [facility] Patient Information 2014 [place], [place].   ________________________________________________________________________

## 2019-04-03 NOTE — Telephone Encounter (Signed)
Called patient. Left message asking patient if he could come in the morning for his appointment tomorrow versus in the afternoon. I ask him to call us back if so.

## 2019-04-04 ENCOUNTER — Encounter: Payer: Self-pay | Admitting: Orthopaedic Surgery

## 2019-04-04 ENCOUNTER — Other Ambulatory Visit: Payer: Self-pay

## 2019-04-04 ENCOUNTER — Ambulatory Visit (INDEPENDENT_AMBULATORY_CARE_PROVIDER_SITE_OTHER): Payer: PPO | Admitting: Orthopaedic Surgery

## 2019-04-04 VITALS — BP 129/69 | HR 58 | Ht 70.0 in | Wt 194.0 lb

## 2019-04-04 DIAGNOSIS — M1711 Unilateral primary osteoarthritis, right knee: Secondary | ICD-10-CM | POA: Diagnosis not present
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
## 2019-04-05 ENCOUNTER — Encounter (HOSPITAL_COMMUNITY): Payer: Self-pay

## 2019-04-05 ENCOUNTER — Encounter (HOSPITAL_COMMUNITY)
Admission: RE | Admit: 2019-04-05 | Discharge: 2019-04-05 | Disposition: A | Discharge status: Home / Self Care | Payer: PPO | Source: Ambulatory Visit | Attending: Orthopaedic Surgery | Admitting: Orthopaedic Surgery

## 2019-04-05 ENCOUNTER — Other Ambulatory Visit: Payer: Self-pay

## 2019-04-05 ENCOUNTER — Ambulatory Visit (HOSPITAL_COMMUNITY)
Admission: RE | Admit: 2019-04-05 | Discharge: 2019-04-05 | Disposition: A | Discharge status: Home / Self Care | Payer: PPO | Source: Ambulatory Visit | Attending: Orthopedic Surgery | Admitting: Orthopedic Surgery

## 2019-04-05 DIAGNOSIS — I517 Cardiomegaly: Secondary | ICD-10-CM | POA: Diagnosis not present

## 2019-04-05 DIAGNOSIS — Z01818 Encounter for other preprocedural examination: Secondary | ICD-10-CM | POA: Insufficient documentation

## 2019-04-05 HISTORY — DX: Essential (primary) hypertension: I10

## 2019-04-05 LAB — CBC WITH DIFFERENTIAL/PLATELET
Abs Immature Granulocytes: 0.01 10*3/uL (ref 0.00–0.07)
Basophils Absolute: 0 10*3/uL (ref 0.0–0.1)
Basophils Relative: 1 %
Eosinophils Absolute: 0.1 10*3/uL (ref 0.0–0.5)
Eosinophils Relative: 1 %
HCT: 41.4 % (ref 39.0–52.0)
Hemoglobin: 14.1 g/dL (ref 13.0–17.0)
Immature Granulocytes: 0 %
Lymphocytes Relative: 32 %
Lymphs Abs: 1.8 10*3/uL (ref 0.7–4.0)
MCH: 32.5 pg (ref 26.0–34.0)
MCHC: 34.1 g/dL (ref 30.0–36.0)
MCV: 95.4 fL (ref 80.0–100.0)
Monocytes Absolute: 0.6 10*3/uL (ref 0.1–1.0)
Monocytes Relative: 11 %
Neutro Abs: 3.1 10*3/uL (ref 1.7–7.7)
Neutrophils Relative %: 55 %
Platelets: 221 10*3/uL (ref 150–400)
RBC: 4.34 MIL/uL (ref 4.22–5.81)
RDW: 13.1 % (ref 11.5–15.5)
WBC: 5.6 10*3/uL (ref 4.0–10.5)
nRBC: 0 % (ref 0.0–0.2)

## 2019-04-05 LAB — URINALYSIS, ROUTINE W REFLEX MICROSCOPIC
Bilirubin Urine: NEGATIVE
Glucose, UA: NEGATIVE mg/dL
Hgb urine dipstick: NEGATIVE
Ketones, ur: 5 mg/dL — AB
Leukocytes,Ua: NEGATIVE
Nitrite: NEGATIVE
Protein, ur: NEGATIVE mg/dL
Specific Gravity, Urine: 1.027 (ref 1.005–1.030)
pH: 5 (ref 5.0–8.0)

## 2019-04-05 LAB — COMPREHENSIVE METABOLIC PANEL
ALT: 18 U/L (ref 0–44)
AST: 14 U/L — ABNORMAL LOW (ref 15–41)
Albumin: 4.4 g/dL (ref 3.5–5.0)
Alkaline Phosphatase: 57 U/L (ref 38–126)
Anion gap: 5 (ref 5–15)
BUN: 21 mg/dL (ref 8–23)
CO2: 27 mmol/L (ref 22–32)
Calcium: 8.6 mg/dL — ABNORMAL LOW (ref 8.9–10.3)
Chloride: 111 mmol/L (ref 98–111)
Creatinine, Ser: 0.85 mg/dL (ref 0.61–1.24)
GFR calc Af Amer: >60 mL/min (ref >60)
GFR calc non Af Amer: >60 mL/min (ref >60)
Glucose, Bld: 114 mg/dL — ABNORMAL HIGH (ref 70–99)
Potassium: 3.6 mmol/L (ref 3.5–5.1)
Sodium: 143 mmol/L (ref 135–145)
Total Bilirubin: 0.4 mg/dL (ref 0.3–1.2)
Total Protein: 6.8 g/dL (ref 6.5–8.1)

## 2019-04-05 LAB — SURGICAL PCR SCREEN
MRSA, PCR: NEGATIVE
Staphylococcus aureus: NEGATIVE

## 2019-04-05 LAB — PROTIME-INR
INR: 1 (ref 0.8–1.2)
Prothrombin Time: 13.6 seconds (ref 11.4–15.2)

## 2019-04-05 LAB — APTT: aPTT: 33 seconds (ref 24–36)

## 2019-04-05 LAB — ABO/RH: ABO/RH(D): A POS

## 2019-04-05 NOTE — Progress Notes (Signed)
PCP - Dr. Selena Batten Cardiologist - no  Chest x-ray - 04/05/19 EKG - 04/05/19 Stress Test - no ECHO - no Cardiac Cath -no   Sleep Study - no CPAP -   Fasting Blood Sugar - NA Checks Blood Sugar _____ times a day  Blood Thinner Instructions:NA Aspirin Instructions: Last Dose:  Anesthesia review:   Patient denies shortness of breath, fever, cough and chest pain at PAT appointment yes  Patient verbalized understanding of instructions that were given to them at the PAT appointment. Patient was also instructed that they will need to review over the PAT instructions again at home before surgery. yes

## 2019-04-06 LAB — URINE CULTURE: Culture: NO GROWTH

## 2019-04-07 ENCOUNTER — Other Ambulatory Visit: Payer: Self-pay

## 2019-04-07 ENCOUNTER — Other Ambulatory Visit (HOSPITAL_COMMUNITY)
Admission: RE | Admit: 2019-04-07 | Discharge: 2019-04-07 | Disposition: A | Discharge status: Home / Self Care | Payer: PPO | Source: Ambulatory Visit | Attending: Orthopaedic Surgery | Admitting: Orthopaedic Surgery

## 2019-04-07 DIAGNOSIS — Z20822 Contact with and (suspected) exposure to covid-19: Secondary | ICD-10-CM | POA: Insufficient documentation

## 2019-04-07 DIAGNOSIS — Z01812 Encounter for preprocedural laboratory examination: Secondary | ICD-10-CM | POA: Diagnosis not present

## 2019-04-07 LAB — SARS CORONAVIRUS 2 (TAT 6-24 HRS): SARS Coronavirus 2: NEGATIVE

## 2019-04-10 MED ORDER — TRANEXAMIC ACID 1000 MG/10ML IV SOLN
2000.0000 mg | INTRAVENOUS | Status: DC
  Filled 2019-04-10: qty 20

## 2019-04-10 NOTE — Anesthesia Preprocedure Evaluation (Addendum)
Anesthesia Evaluation  Patient identified by MRN, date of birth, ID band Patient awake    Reviewed: Allergy & Precautions, H&P , NPO status , Patient's Chart, lab work & pertinent test results  Airway Mallampati: II  TM Distance: >3 FB Neck ROM: Full    Dental no notable dental hx. (+) Teeth Intact, Dental Advisory Given   Pulmonary neg pulmonary ROS, former smoker,    Pulmonary exam normal breath sounds clear to auscultation       Cardiovascular hypertension, Pt. on medications  Rhythm:Regular Rate:Normal     Neuro/Psych Seizures -, Well Controlled,  negative psych ROS   GI/Hepatic negative GI ROS, Neg liver ROS,   Endo/Other  negative endocrine ROS  Renal/GU negative Renal ROS  negative genitourinary   Musculoskeletal  (+) Arthritis , Osteoarthritis,    Abdominal   Peds  Hematology negative hematology ROS (+)   Anesthesia Other Findings   Reproductive/Obstetrics negative OB ROS                            Anesthesia Physical Anesthesia Plan  ASA: II  Anesthesia Plan: Spinal   Post-op Pain Management:  Regional for Post-op pain   Induction: Intravenous  PONV Risk Score and Plan: 2 and Ondansetron, Dexamethasone, Midazolam and Propofol infusion  Airway Management Planned: Simple Face Mask  Additional Equipment:   Intra-op Plan:   Post-operative Plan:   Informed Consent: I have reviewed the patients History and Physical, chart, labs and discussed the procedure including the risks, benefits and alternatives for the proposed anesthesia with the patient or authorized representative who has indicated his/her understanding and acceptance.     Dental advisory given  Plan Discussed with: CRNA  Anesthesia Plan Comments:         Anesthesia Quick Evaluation

## 2019-04-11 ENCOUNTER — Other Ambulatory Visit: Payer: Self-pay

## 2019-04-11 ENCOUNTER — Encounter (HOSPITAL_COMMUNITY): Payer: Self-pay | Admitting: Orthopaedic Surgery

## 2019-04-11 ENCOUNTER — Encounter (HOSPITAL_COMMUNITY)
Admission: RE | Disposition: A | Discharge status: Home Health | Payer: Self-pay | Source: Home / Self Care | Attending: Orthopaedic Surgery

## 2019-04-11 ENCOUNTER — Ambulatory Visit (HOSPITAL_COMMUNITY): Payer: PPO | Admitting: Physician Assistant

## 2019-04-11 ENCOUNTER — Inpatient Hospital Stay (HOSPITAL_COMMUNITY)
Admission: RE | Admit: 2019-04-11 | Discharge: 2019-04-14 | DRG: 470 | Disposition: A | Discharge status: Home Health | Payer: PPO | Attending: Orthopaedic Surgery | Admitting: Orthopaedic Surgery

## 2019-04-11 ENCOUNTER — Ambulatory Visit (HOSPITAL_COMMUNITY): Payer: PPO | Admitting: Certified Registered Nurse Anesthetist

## 2019-04-11 DIAGNOSIS — Z888 Allergy status to other drugs, medicaments and biological substances status: Secondary | ICD-10-CM

## 2019-04-11 DIAGNOSIS — Z79891 Long term (current) use of opiate analgesic: Secondary | ICD-10-CM

## 2019-04-11 DIAGNOSIS — M79609 Pain in unspecified limb: Secondary | ICD-10-CM | POA: Diagnosis not present

## 2019-04-11 DIAGNOSIS — G40909 Epilepsy, unspecified, not intractable, without status epilepticus: Secondary | ICD-10-CM | POA: Diagnosis present

## 2019-04-11 DIAGNOSIS — I10 Essential (primary) hypertension: Secondary | ICD-10-CM | POA: Diagnosis not present

## 2019-04-11 DIAGNOSIS — R410 Disorientation, unspecified: Secondary | ICD-10-CM | POA: Diagnosis not present

## 2019-04-11 DIAGNOSIS — M659 Synovitis and tenosynovitis, unspecified: Secondary | ICD-10-CM | POA: Diagnosis present

## 2019-04-11 DIAGNOSIS — Z20822 Contact with and (suspected) exposure to covid-19: Secondary | ICD-10-CM | POA: Diagnosis present

## 2019-04-11 DIAGNOSIS — M1711 Unilateral primary osteoarthritis, right knee: Principal | ICD-10-CM | POA: Diagnosis present

## 2019-04-11 DIAGNOSIS — Z8546 Personal history of malignant neoplasm of prostate: Secondary | ICD-10-CM | POA: Diagnosis not present

## 2019-04-11 DIAGNOSIS — Z87820 Personal history of traumatic brain injury: Secondary | ICD-10-CM | POA: Diagnosis not present

## 2019-04-11 DIAGNOSIS — Z87891 Personal history of nicotine dependence: Secondary | ICD-10-CM

## 2019-04-11 DIAGNOSIS — M25461 Effusion, right knee: Secondary | ICD-10-CM | POA: Diagnosis present

## 2019-04-11 DIAGNOSIS — R569 Unspecified convulsions: Secondary | ICD-10-CM | POA: Diagnosis not present

## 2019-04-11 DIAGNOSIS — Z973 Presence of spectacles and contact lenses: Secondary | ICD-10-CM | POA: Diagnosis not present

## 2019-04-11 DIAGNOSIS — R351 Nocturia: Secondary | ICD-10-CM | POA: Diagnosis not present

## 2019-04-11 DIAGNOSIS — R5381 Other malaise: Secondary | ICD-10-CM | POA: Diagnosis not present

## 2019-04-11 DIAGNOSIS — Z471 Aftercare following joint replacement surgery: Secondary | ICD-10-CM | POA: Diagnosis not present

## 2019-04-11 DIAGNOSIS — M7121 Synovial cyst of popliteal space [Baker], right knee: Secondary | ICD-10-CM | POA: Diagnosis present

## 2019-04-11 DIAGNOSIS — Z96651 Presence of right artificial knee joint: Secondary | ICD-10-CM

## 2019-04-11 DIAGNOSIS — Z79899 Other long term (current) drug therapy: Secondary | ICD-10-CM

## 2019-04-11 DIAGNOSIS — G8918 Other acute postprocedural pain: Secondary | ICD-10-CM | POA: Diagnosis not present

## 2019-04-11 DIAGNOSIS — R279 Unspecified lack of coordination: Secondary | ICD-10-CM | POA: Diagnosis not present

## 2019-04-11 DIAGNOSIS — Z741 Need for assistance with personal care: Secondary | ICD-10-CM | POA: Diagnosis not present

## 2019-04-11 DIAGNOSIS — Z96661 Presence of right artificial ankle joint: Secondary | ICD-10-CM | POA: Diagnosis not present

## 2019-04-11 DIAGNOSIS — R262 Difficulty in walking, not elsewhere classified: Secondary | ICD-10-CM | POA: Diagnosis not present

## 2019-04-11 DIAGNOSIS — Z743 Need for continuous supervision: Secondary | ICD-10-CM | POA: Diagnosis not present

## 2019-04-11 DIAGNOSIS — M6281 Muscle weakness (generalized): Secondary | ICD-10-CM | POA: Diagnosis not present

## 2019-04-11 HISTORY — PX: TOTAL KNEE ARTHROPLASTY: SHX125

## 2019-04-11 LAB — TYPE AND SCREEN
ABO/RH(D): A POS
Antibody Screen: NEGATIVE

## 2019-04-11 SURGERY — ARTHROPLASTY, KNEE, TOTAL
Anesthesia: Spinal | Site: Knee | Laterality: Right

## 2019-04-11 MED ORDER — LACTATED RINGERS IV BOLUS
500.0000 mL | Freq: Once | INTRAVENOUS | Status: AC
  Administered 2019-04-11: 500 mL via INTRAVENOUS

## 2019-04-11 MED ORDER — PHENYTOIN SODIUM EXTENDED 100 MG PO CAPS
200.0000 mg | ORAL_CAPSULE | Freq: Every day | ORAL | Status: DC
  Administered 2019-04-12 – 2019-04-14 (×3): 200 mg via ORAL
  Filled 2019-04-11 (×3): qty 2

## 2019-04-11 MED ORDER — FENTANYL CITRATE (PF) 100 MCG/2ML IJ SOLN
INTRAMUSCULAR | Status: AC
  Filled 2019-04-11: qty 2

## 2019-04-11 MED ORDER — POVIDONE-IODINE 10 % EX SWAB
2.0000 "application " | Freq: Once | CUTANEOUS | Status: AC
  Administered 2019-04-11: 2 via TOPICAL

## 2019-04-11 MED ORDER — BUPIVACAINE-EPINEPHRINE 0.5% -1:200000 IJ SOLN
INTRAMUSCULAR | Status: DC | PRN
  Administered 2019-04-11: 30 mL

## 2019-04-11 MED ORDER — METHOCARBAMOL 500 MG PO TABS
500.0000 mg | ORAL_TABLET | Freq: Four times a day (QID) | ORAL | Status: DC | PRN
  Administered 2019-04-11 – 2019-04-13 (×6): 500 mg via ORAL
  Filled 2019-04-11 (×7): qty 1

## 2019-04-11 MED ORDER — PHENYLEPHRINE 40 MCG/ML (10ML) SYRINGE FOR IV PUSH (FOR BLOOD PRESSURE SUPPORT)
PREFILLED_SYRINGE | INTRAVENOUS | Status: AC
  Filled 2019-04-11: qty 10

## 2019-04-11 MED ORDER — METHOCARBAMOL 500 MG PO TABS: 500.0000 mg | ORAL_TABLET | Freq: Three times a day (TID) | ORAL | 0 refills | Status: DC | PRN

## 2019-04-11 MED ORDER — PROPOFOL 10 MG/ML IV BOLUS
INTRAVENOUS | Status: DC | PRN
  Administered 2019-04-11: 20 mg via INTRAVENOUS

## 2019-04-11 MED ORDER — MIDAZOLAM HCL 5 MG/5ML IJ SOLN
INTRAMUSCULAR | Status: DC | PRN
  Administered 2019-04-11 (×2): 1 mg via INTRAVENOUS

## 2019-04-11 MED ORDER — PROPOFOL 500 MG/50ML IV EMUL
INTRAVENOUS | Status: DC | PRN
  Administered 2019-04-11: 50 ug/kg/min via INTRAVENOUS

## 2019-04-11 MED ORDER — PROPOFOL 10 MG/ML IV BOLUS
INTRAVENOUS | Status: AC
  Filled 2019-04-11: qty 20

## 2019-04-11 MED ORDER — PROPOFOL 500 MG/50ML IV EMUL
INTRAVENOUS | Status: AC
  Filled 2019-04-11: qty 50

## 2019-04-11 MED ORDER — BUPIVACAINE IN DEXTROSE 0.75-8.25 % IT SOLN
INTRATHECAL | Status: DC | PRN
  Administered 2019-04-11: 1.6 mL via INTRATHECAL

## 2019-04-11 MED ORDER — CEFAZOLIN SODIUM-DEXTROSE 2-4 GM/100ML-% IV SOLN
2.0000 g | INTRAVENOUS | Status: AC
  Administered 2019-04-11: 08:00:00 2 g via INTRAVENOUS
  Filled 2019-04-11: qty 100

## 2019-04-11 MED ORDER — 0.9 % SODIUM CHLORIDE (POUR BTL) OPTIME
TOPICAL | Status: DC | PRN
  Administered 2019-04-11: 1000 mL

## 2019-04-11 MED ORDER — CHLORHEXIDINE GLUCONATE 4 % EX LIQD: 60.0000 mL | Freq: Once | CUTANEOUS | Status: DC

## 2019-04-11 MED ORDER — BUPIVACAINE-EPINEPHRINE (PF) 0.5% -1:200000 IJ SOLN
INTRAMUSCULAR | Status: DC | PRN
  Administered 2019-04-11: 20 mL via PERINEURAL

## 2019-04-11 MED ORDER — SODIUM CHLORIDE 0.9 % IV SOLN: INTRAVENOUS | Status: DC

## 2019-04-11 MED ORDER — TRANEXAMIC ACID-NACL 1000-0.7 MG/100ML-% IV SOLN
INTRAVENOUS | Status: DC | PRN
  Administered 2019-04-11: 1000 mg via INTRAVENOUS

## 2019-04-11 MED ORDER — METOCLOPRAMIDE HCL 5 MG/ML IJ SOLN: 5.0000 mg | Freq: Three times a day (TID) | INTRAMUSCULAR | Status: DC | PRN

## 2019-04-11 MED ORDER — ONDANSETRON HCL 4 MG/2ML IJ SOLN
INTRAMUSCULAR | Status: DC | PRN
  Administered 2019-04-11: 4 mg via INTRAVENOUS

## 2019-04-11 MED ORDER — ASPIRIN 81 MG PO CHEW
81.0000 mg | CHEWABLE_TABLET | Freq: Two times a day (BID) | ORAL | Status: DC
  Administered 2019-04-11 – 2019-04-14 (×6): 81 mg via ORAL
  Filled 2019-04-11 (×6): qty 1

## 2019-04-11 MED ORDER — BUPIVACAINE HCL (PF) 0.5 % IJ SOLN
INTRAMUSCULAR | Status: AC
  Filled 2019-04-11: qty 30

## 2019-04-11 MED ORDER — MIDAZOLAM HCL 2 MG/2ML IJ SOLN
INTRAMUSCULAR | Status: AC
  Filled 2019-04-11: qty 2

## 2019-04-11 MED ORDER — DIPHENHYDRAMINE HCL 12.5 MG/5ML PO ELIX: 12.5000 mg | ORAL_SOLUTION | ORAL | Status: DC | PRN

## 2019-04-11 MED ORDER — FENTANYL CITRATE (PF) 100 MCG/2ML IJ SOLN
INTRAMUSCULAR | Status: DC | PRN
  Administered 2019-04-11: 50 ug via INTRAVENOUS
  Administered 2019-04-11 (×3): 25 ug via INTRAVENOUS

## 2019-04-11 MED ORDER — OXYCODONE HCL 5 MG PO TABS: 5.0000 mg | ORAL_TABLET | ORAL | 0 refills | Status: DC | PRN

## 2019-04-11 MED ORDER — LACTATED RINGERS IV SOLN: INTRAVENOUS | Status: DC

## 2019-04-11 MED ORDER — MAGNESIUM CITRATE PO SOLN
1.0000 | Freq: Once | ORAL | Status: AC | PRN
  Administered 2019-04-14: 1 via ORAL
  Filled 2019-04-11: qty 296

## 2019-04-11 MED ORDER — HYDROMORPHONE HCL 1 MG/ML IJ SOLN: 0.2500 mg | INTRAMUSCULAR | Status: DC | PRN

## 2019-04-11 MED ORDER — SODIUM CHLORIDE 0.9 % IR SOLN
Status: DC | PRN
  Administered 2019-04-11: 1000 mL

## 2019-04-11 MED ORDER — SODIUM CHLORIDE 0.9 % IV SOLN
75.0000 mL/h | INTRAVENOUS | Status: DC
  Administered 2019-04-11: 75 mL/h via INTRAVENOUS

## 2019-04-11 MED ORDER — MENTHOL 3 MG MT LOZG: 1.0000 | LOZENGE | OROMUCOSAL | Status: DC | PRN

## 2019-04-11 MED ORDER — PHENYLEPHRINE 40 MCG/ML (10ML) SYRINGE FOR IV PUSH (FOR BLOOD PRESSURE SUPPORT)
PREFILLED_SYRINGE | INTRAVENOUS | Status: DC | PRN
  Administered 2019-04-11 (×3): 80 ug via INTRAVENOUS

## 2019-04-11 MED ORDER — LACTATED RINGERS IV BOLUS
250.0000 mL | Freq: Once | INTRAVENOUS | Status: AC
  Administered 2019-04-12: 250 mL via INTRAVENOUS

## 2019-04-11 MED ORDER — METOCLOPRAMIDE HCL 5 MG PO TABS: 5.0000 mg | ORAL_TABLET | Freq: Three times a day (TID) | ORAL | Status: DC | PRN

## 2019-04-11 MED ORDER — MAGNESIUM HYDROXIDE 400 MG/5ML PO SUSP
30.0000 mL | Freq: Every day | ORAL | Status: DC | PRN
  Administered 2019-04-13 – 2019-04-14 (×2): 30 mL via ORAL
  Filled 2019-04-11 (×2): qty 30

## 2019-04-11 MED ORDER — TRANEXAMIC ACID-NACL 1000-0.7 MG/100ML-% IV SOLN
INTRAVENOUS | Status: AC
  Filled 2019-04-11: qty 100

## 2019-04-11 MED ORDER — ONDANSETRON HCL 4 MG/2ML IJ SOLN: 4.0000 mg | Freq: Four times a day (QID) | INTRAMUSCULAR | Status: DC | PRN

## 2019-04-11 MED ORDER — CEFAZOLIN SODIUM-DEXTROSE 2-4 GM/100ML-% IV SOLN
2.0000 g | Freq: Four times a day (QID) | INTRAVENOUS | Status: AC
  Administered 2019-04-11 (×2): 2 g via INTRAVENOUS
  Filled 2019-04-11 (×2): qty 100

## 2019-04-11 MED ORDER — ACETAMINOPHEN 10 MG/ML IV SOLN
1000.0000 mg | Freq: Four times a day (QID) | INTRAVENOUS | Status: DC
  Administered 2019-04-11: 1000 mg via INTRAVENOUS
  Filled 2019-04-11: qty 100

## 2019-04-11 MED ORDER — PHENOL 1.4 % MT LIQD: 1.0000 | OROMUCOSAL | Status: DC | PRN

## 2019-04-11 MED ORDER — ONDANSETRON HCL 4 MG/2ML IJ SOLN
INTRAMUSCULAR | Status: AC
  Filled 2019-04-11: qty 2

## 2019-04-11 MED ORDER — METHOCARBAMOL 1000 MG/10ML IJ SOLN
500.0000 mg | Freq: Four times a day (QID) | INTRAVENOUS | Status: DC | PRN
  Filled 2019-04-11: qty 5

## 2019-04-11 MED ORDER — DOCUSATE SODIUM 100 MG PO CAPS
100.0000 mg | ORAL_CAPSULE | Freq: Two times a day (BID) | ORAL | Status: DC
  Administered 2019-04-11 – 2019-04-14 (×7): 100 mg via ORAL
  Filled 2019-04-11 (×7): qty 1

## 2019-04-11 MED ORDER — HYDROMORPHONE HCL 1 MG/ML IJ SOLN
0.5000 mg | INTRAMUSCULAR | Status: DC | PRN
  Administered 2019-04-12: 1 mg via INTRAVENOUS
  Filled 2019-04-11: qty 1

## 2019-04-11 MED ORDER — PHENYTOIN SODIUM EXTENDED 100 MG PO CAPS
100.0000 mg | ORAL_CAPSULE | Freq: Every day | ORAL | Status: DC
  Administered 2019-04-11 – 2019-04-13 (×3): 100 mg via ORAL
  Filled 2019-04-11 (×4): qty 1

## 2019-04-11 MED ORDER — BISACODYL 10 MG RE SUPP: 10.0000 mg | Freq: Every day | RECTAL | Status: DC | PRN

## 2019-04-11 MED ORDER — OXYCODONE HCL 5 MG PO TABS
5.0000 mg | ORAL_TABLET | ORAL | Status: DC | PRN
  Administered 2019-04-11: 10 mg via ORAL
  Administered 2019-04-11: 5 mg via ORAL
  Administered 2019-04-12 (×5): 10 mg via ORAL
  Administered 2019-04-13 – 2019-04-14 (×3): 5 mg via ORAL
  Filled 2019-04-11 (×4): qty 2
  Filled 2019-04-11 (×2): qty 1
  Filled 2019-04-11 (×2): qty 2
  Filled 2019-04-11 (×3): qty 1

## 2019-04-11 MED ORDER — DEXAMETHASONE SODIUM PHOSPHATE 10 MG/ML IJ SOLN
INTRAMUSCULAR | Status: AC
  Filled 2019-04-11: qty 1

## 2019-04-11 MED ORDER — PHENYTOIN SODIUM EXTENDED 100 MG PO CAPS: 100.0000 mg | ORAL_CAPSULE | Freq: Two times a day (BID) | ORAL | Status: DC

## 2019-04-11 MED ORDER — AMLODIPINE BESYLATE 5 MG PO TABS
5.0000 mg | ORAL_TABLET | Freq: Every day | ORAL | Status: DC
  Administered 2019-04-12 – 2019-04-14 (×3): 5 mg via ORAL
  Filled 2019-04-11 (×3): qty 1

## 2019-04-11 MED ORDER — ONDANSETRON HCL 4 MG PO TABS: 4.0000 mg | ORAL_TABLET | Freq: Four times a day (QID) | ORAL | Status: DC | PRN

## 2019-04-11 MED ORDER — ALUM & MAG HYDROXIDE-SIMETH 200-200-20 MG/5ML PO SUSP: 30.0000 mL | ORAL | Status: DC | PRN

## 2019-04-11 SURGICAL SUPPLY — 55 items
BAG DECANTER FOR FLEXI CONT (MISCELLANEOUS) ×3 IMPLANT
BAG ZIPLOCK 12X15 (MISCELLANEOUS) ×3 IMPLANT
BLADE SAGITTAL 25.0X1.19X90 (BLADE) ×2 IMPLANT
BLADE SAGITTAL 25.0X1.19X90MM (BLADE) ×1
BOWL SMART MIX CTS (DISPOSABLE) ×3 IMPLANT
CEMENT HV SMART SET (Cement) ×6 IMPLANT
CEMENT TIBIA MBT SIZE 4 (Knees) IMPLANT
COMP FEM CEM STD+ RT LCS (Orthopedic Implant) ×3 IMPLANT
COMP PATELLA PEGX3 CEM STAN+ (Knees) ×3 IMPLANT
COMPONENT FEM CEM STD+ RT LCS (Orthopedic Implant) IMPLANT
COMPONENT PTLA PEGX3 CEM STAN+ (Knees) IMPLANT
COVER SURGICAL LIGHT HANDLE (MISCELLANEOUS) ×3 IMPLANT
COVER WAND RF STERILE (DRAPES) IMPLANT
CUFF TOURN SGL QUICK 34 (TOURNIQUET CUFF) ×3
CUFF TRNQT CYL 34X4.125X (TOURNIQUET CUFF) ×1 IMPLANT
DECANTER SPIKE VIAL GLASS SM (MISCELLANEOUS) ×3 IMPLANT
DRAPE IMP U-DRAPE 54X76 (DRAPES) ×3 IMPLANT
DRAPE SHEET LG 3/4 BI-LAMINATE (DRAPES) ×6 IMPLANT
DRSG ADAPTIC 3X8 NADH LF (GAUZE/BANDAGES/DRESSINGS) ×3 IMPLANT
DRSG PAD ABDOMINAL 8X10 ST (GAUZE/BANDAGES/DRESSINGS) ×3 IMPLANT
DURAPREP 26ML APPLICATOR (WOUND CARE) ×6 IMPLANT
ELECT REM PT RETURN 15FT ADLT (MISCELLANEOUS) ×3 IMPLANT
GAUZE SPONGE 4X4 12PLY STRL (GAUZE/BANDAGES/DRESSINGS) ×3 IMPLANT
GLOVE BIOGEL PI IND STRL 8 (GLOVE) ×1 IMPLANT
GLOVE BIOGEL PI IND STRL 8.5 (GLOVE) ×1 IMPLANT
GLOVE BIOGEL PI INDICATOR 8 (GLOVE) ×2
GLOVE BIOGEL PI INDICATOR 8.5 (GLOVE) ×2
GLOVE ECLIPSE 8.0 STRL XLNG CF (GLOVE) ×6 IMPLANT
GLOVE ECLIPSE 8.5 STRL (GLOVE) ×6 IMPLANT
GOWN STRL REUS W/ TWL LRG LVL3 (GOWN DISPOSABLE) ×1 IMPLANT
GOWN STRL REUS W/TWL 2XL LVL3 (GOWN DISPOSABLE) ×3 IMPLANT
GOWN STRL REUS W/TWL LRG LVL3 (GOWN DISPOSABLE) ×3
HANDPIECE INTERPULSE COAX TIP (DISPOSABLE) ×3
HOLDER FOLEY CATH W/STRAP (MISCELLANEOUS) IMPLANT
INSERT TIB LCS RP STD+ 12.5 (Knees) ×2 IMPLANT
KIT TURNOVER KIT A (KITS) IMPLANT
MANIFOLD NEPTUNE II (INSTRUMENTS) ×3 IMPLANT
NS IRRIG 1000ML POUR BTL (IV SOLUTION) ×3 IMPLANT
PACK TOTAL KNEE CUSTOM (KITS) ×3 IMPLANT
PADDING CAST COTTON 6X4 STRL (CAST SUPPLIES) ×4 IMPLANT
PENCIL SMOKE EVACUATOR (MISCELLANEOUS) IMPLANT
PIN STEINMAN FIXATION KNEE (PIN) ×2 IMPLANT
PROTECTOR NERVE ULNAR (MISCELLANEOUS) ×3 IMPLANT
SET HNDPC FAN SPRY TIP SCT (DISPOSABLE) ×1 IMPLANT
STAPLER VISISTAT 35W (STAPLE) ×3 IMPLANT
SUT BONE WAX W31G (SUTURE) ×3 IMPLANT
SUT ETHIBOND NAB CT1 #1 30IN (SUTURE) ×6 IMPLANT
SUT MNCRL AB 3-0 PS2 18 (SUTURE) ×3 IMPLANT
SUT VIC AB 2-0 PS2 27 (SUTURE) ×3 IMPLANT
TIBIA MBT CEMENT SIZE 4 (Knees) ×3 IMPLANT
TRAY FOLEY MTR SLVR 16FR STAT (SET/KITS/TRAYS/PACK) ×3 IMPLANT
UNDERPAD 30X36 HEAVY ABSORB (UNDERPADS AND DIAPERS) ×3 IMPLANT
WATER STERILE IRR 1000ML POUR (IV SOLUTION) ×6 IMPLANT
WRAP KNEE MAXI GEL POST OP (GAUZE/BANDAGES/DRESSINGS) ×3 IMPLANT
YANKAUER SUCT BULB TIP 10FT TU (MISCELLANEOUS) ×3 IMPLANT

## 2019-04-11 NOTE — Op Note (Signed)
NAME: Darin Williamson, Darin Williamson MEDICAL RECORD OB:[id number] ACCOUNT [id number] DATE OF BIRTH:09/20/1945 FACILITY: [facility] LOCATION: [place], [place]  OPERATIVE REPORT  DATE OF PROCEDURE:  04/11/2019  PREOPERATIVE DIAGNOSIS:  End-stage osteoarthritis, right knee.  POSTOPERATIVE DIAGNOSIS:  End-stage osteoarthritis, right knee.  PROCEDURE:  Right total knee replacement.  SURGEON:  Joni Fears, MD  ASSISTANT:  Biagio Borg, PA-C  ANESTHESIA:  IV sedation, Spinal and adductor canal block.  COMPLICATIONS:  None.  COMPONENTS:  DePuy LCS standard plus femoral component, a #4 rotating keeled tibial tray with a 12.5 mm polyethylene bridging bearing, a metal backed 3-peg rotating patella.  All components were secured with polymethyl methacrylate.  DESCRIPTION OF PROCEDURE:  The patient was met in the holding area, identified the right knee as the appropriate operative site and marked it accordingly.  Anesthesia performed an adductor canal block.  The patient was then transported to room #4.  Anesthesia performed spinal anesthetic without difficulty under IV sedation.  The patient was then placed comfortably on the operating table in the supine position.  Nursing staff inserted a Foley catheter.  Urine was clear.  Tourniquet was then applied to the right thigh.  The right leg was then prepared with chlorhexidine scrub and DuraPrep x2 from the tourniquet to the tips of the toes.  Sterile draping was performed.  A timeout was called.  A midline longitudinal incision was then made centered about the patella, extending from the superior pouch to the tibial tubercle.  Via sharp dissection, incision was carried down to subcutaneous tissue.  The first layer of capsule was incised in the  midline.  A medial parapatellar incision was then made through the deep capsule.  The joint was then entered.  There was a clear yellow joint effusion.  The patella was everted 180 degrees  laterally, the knee flexed to 90 degrees.  There was considerable  loss of articular cartilage in the medial compartment.  There was a slight foot fixed flexion contracture.  There was a moderate amount of beefy red synovitis.  A synovectomy was performed.  Moderate sized osteophytes were removed from the femoral  condyle.  I sized the standard plus femoral component.  First a bony cut was then made transversely in the proximal tibia using the external tibial guide with 7 degrees of posterior declination.  After each bony cut on the tibia and femur, I checked the alignment with the external guide.  Subsequent cuts were then made on the femur using the standard plus guide.  Flexion and extension gaps were symmetrical at 12.5 mm.  I then inserted the laminar spreaders.  I removed medial and lateral menisci, ACL and PCL and osteophytes from the  posterior femoral condyle with a 3/4-inch curved osteotome.  There was a large popliteal cyst.  As much of the cyst as I could visualize I excised with the Bovie.  We then checked flexion and extension gaps again at 12.5 mm.  I used a 4-degree distal femoral valgus cut.  Finishing guide was then applied to the tibia to obtain the tapered cuts in the center holes.   A retractor was then placed around the tibia.  It was advanced anteriorly.  I measured a #4 tibial tray.  This was pinned in place and alignment checked.  A center hole was then made, followed by the keeled cut.  With the tibial tray in place, the 12.5  mm polyethylene bridging bearing was then applied, followed by the trial standard plus femoral component.  The entire construct was reduced.  There was full extension, no opening with varus or valgus stress and no malrotation of the tibial tubercle  component.  The patella was then prepared by removing 11 mm of bone, leaving 15 mm of patella thickness.  The patellar jig was applied, 3 holes made, the trial patella inserted and reduced.  Through a full  range of motion, it remained stable.  The trial components were then removed.  The joint was copiously irrigated with saline solution.  The final components were then impacted with polymethyl methacrylate.  Initially, applied the #4 tibial tray, followed by the 12.5 mm polyethylene bridging bearing and the standard plus femoral component.  The joint was reduced.  Extraneous methacrylate  was removed from the periphery of the components.  The patella was applied with methacrylate and a patellar clamp.  At approximately 16 minutes, the methacrylate had matured, during which time, we irrigated the joint and injected 0.25% Marcaine with epinephrine into the deep capsule.  The patient did  receive a gram of tranexamic acid IV preoperatively and 2 g of Ancef.  At 60 minutes, the tourniquet was deflated.  We had immediate capillary refill to the joint surface.  Gross bleeders were Bovie coagulated.  We felt we had a nice dry field at that point.  Deep capsule was closed with a running #1 Ethibond, superficial  capsule closed with Vicryl, subcutaneous with 3-0 Monocryl, skin closed with skin clips.  A sterile bulky dressing was applied, followed by patient support stocking.  The patient tolerated the procedure without complications.  VN/NUANCE  D:04/11/2019 T:04/11/2019 JOB:[id number]

## 2019-04-11 NOTE — Anesthesia Procedure Notes (Signed)
Spinal  Patient location during procedure: OR Start time: 04/11/2019 7:28 AM End time: 04/11/2019 7:33 AM Staffing Performed: anesthesiologist  Anesthesiologist: Roderic Palau, MD Preanesthetic Checklist Completed: patient identified, IV checked, risks and benefits discussed, surgical consent, monitors and equipment checked, pre-op evaluation and timeout performed Spinal Block Patient position: sitting Prep: DuraPrep Patient monitoring: cardiac monitor, continuous pulse ox and blood pressure Approach: midline Location: L3-4 Injection technique: single-shot Needle Needle type: Pencan  Needle gauge: 24 G Needle length: 9 cm Assessment Sensory level: T8 Additional Notes Functioning IV was confirmed and monitors were applied. Sterile prep and drape, including hand hygiene and sterile gloves were used. The patient was positioned and the spine was prepped. The skin was anesthetized with lidocaine.  Free flow of clear CSF was obtained prior to injecting local anesthetic into the CSF.  The spinal needle aspirated freely following injection.  The needle was carefully withdrawn.  The patient tolerated the procedure well.
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
## 2019-04-11 NOTE — Transfer of Care (Signed)
Immediate Anesthesia Transfer of Care Note  Patient: ROLONDO PIERRE  Procedure(s) Performed: RIGHT TOTAL KNEE ARTHROPLASTY (Right Knee)  Patient Location: PACU  Anesthesia Type:Spinal and MAC combined with regional for post-op pain  Level of Consciousness: awake, alert  and patient cooperative  Airway & Oxygen Therapy: Patient Spontanous Breathing and Patient connected to face mask oxygen  Post-op Assessment: Report given to RN and Post -op Vital signs reviewed and stable  Post vital signs: Reviewed and stable  Last Vitals:  Vitals Value Taken Time  BP 133/87 04/11/19 0930  Temp    Pulse 71 04/11/19 0931  Resp 19 04/11/19 0931  SpO2 98 % 04/11/19 0931  Vitals shown include unvalidated device data.  Last Pain:  Vitals:   04/11/19 0552  TempSrc:   PainSc: 0-No pain         Complications: No apparent anesthesia complications

## 2019-04-11 NOTE — Op Note (Signed)
PATIENT ID:      ROEN TEBEAU  MRN:     [id number] DOB/AGE:    [birth year] / 74 y.o.       OPERATIVE REPORT    DATE OF PROCEDURE:  04/11/2019       PREOPERATIVE DIAGNOSIS:end stage   right knee osteoarthritis                                                       Estimated body mass index is 28.28 kg/m as calculated from the following:   Height as of 04/05/19: 5\' 10"  (1.778 m).   Weight as of this encounter: 89.4 kg.     POSTOPERATIVE DIAGNOSIS: end stage  right knee osteoarthritis                                                                     Estimated body mass index is 28.28 kg/m as calculated from the following:   Height as of 04/05/19: 5\' 10"  (1.778 m).   Weight as of this encounter: 89.4 kg.     PROCEDURE:  Procedure(s): RIGHT TOTAL KNEE ARTHROPLASTY      SURGEON:  Joni Fears, MD    ASSISTANT:   Biagio Borg, PA-C   (Present and scrubbed throughout the case, critical for assistance with exposure, retraction, instrumentation, and closure.)          ANESTHESIA: regional, spinal and IV sedation     DRAINS: none :      TOURNIQUET TIME:  Total Tourniquet Time Documented: Thigh (Right) - 60 minutes Total: Thigh (Right) - 60 minutes     COMPLICATIONS:  None   CONDITION:  stable  PROCEDURE IN DETAIL: [place] 04/11/2019, 9:08 AM

## 2019-04-11 NOTE — Anesthesia Procedure Notes (Signed)
Anesthesia Regional Block: Adductor canal block   Pre-Anesthetic Checklist: ,, timeout performed, Correct Patient, Correct Site, Correct Laterality, Correct Procedure, Correct Position, site marked, Risks and benefits discussed, pre-op evaluation,  At surgeon's request and post-op pain management  Laterality: Right  Prep: Maximum Sterile Barrier Precautions used, chloraprep       Needles:  Injection technique: Single-shot  Needle Type: Echogenic Stimulator Needle     Needle Length: 9cm  Needle Gauge: 21     Additional Needles:   Procedures:,,,, ultrasound used (permanent image in chart),,,,  Narrative:  Start time: 04/11/2019 6:40 AM End time: 04/11/2019 6:50 AM Injection made incrementally with aspirations every 5 mL.  Performed by: Personally  Anesthesiologist: Roderic Palau, MD  Additional Notes: 2% Lidocaine skin wheel.

## 2019-04-11 NOTE — Anesthesia Postprocedure Evaluation (Signed)
Anesthesia Post Note  Patient: Darin Williamson  Procedure(s) Performed: RIGHT TOTAL KNEE ARTHROPLASTY (Right Knee)     Patient location during evaluation: PACU Anesthesia Type: Spinal and Regional Level of consciousness: oriented and awake and alert Pain management: pain level controlled Vital Signs Assessment: post-procedure vital signs reviewed and stable Respiratory status: spontaneous breathing and respiratory function stable Cardiovascular status: blood pressure returned to baseline and stable Postop Assessment: no headache, no backache, no apparent nausea or vomiting, spinal receding and patient able to bend at knees Anesthetic complications: no    Last Vitals:  Vitals:   04/11/19 1015 04/11/19 1030  BP: (!) 160/88 (!) 157/90  Pulse: (!) 59 (!) 47  Resp: 17 14  Temp:  (!) 36.3 C  SpO2: 98% 100%    Last Pain:  Vitals:   04/11/19 1030  TempSrc:   PainSc: 0-No pain                 Trinisha Paget,W. EDMOND

## 2019-04-11 NOTE — H&P (Signed)
The recent History & Physical has been reviewed. I have personally examined the patient today. There is no interval change to the documented History & Physical. The patient would like to proceed with the procedure.  Garald Balding 04/11/2019,  7:16 AM

## 2019-04-11 NOTE — Anesthesia Procedure Notes (Signed)
Procedure Name: MAC Date/Time: 04/11/2019 7:26 AM Performed by: West Pugh, CRNA Pre-anesthesia Checklist: Patient identified, Emergency Drugs available, Suction available, Patient being monitored and Timeout performed Patient Re-evaluated:Patient Re-evaluated prior to induction Oxygen Delivery Method: Simple face mask Preoxygenation: Pre-oxygenation with 100% oxygen Induction Type: IV induction Placement Confirmation: positive ETCO2 Dental Injury: Teeth and Oropharynx as per pre-operative assessment

## 2019-04-12 ENCOUNTER — Encounter: Payer: Self-pay | Admitting: *Deleted

## 2019-04-12 DIAGNOSIS — Z87891 Personal history of nicotine dependence: Secondary | ICD-10-CM | POA: Diagnosis not present

## 2019-04-12 DIAGNOSIS — Z79899 Other long term (current) drug therapy: Secondary | ICD-10-CM | POA: Diagnosis not present

## 2019-04-12 DIAGNOSIS — Z888 Allergy status to other drugs, medicaments and biological substances status: Secondary | ICD-10-CM | POA: Diagnosis not present

## 2019-04-12 DIAGNOSIS — M25461 Effusion, right knee: Secondary | ICD-10-CM | POA: Diagnosis present

## 2019-04-12 DIAGNOSIS — Z20822 Contact with and (suspected) exposure to covid-19: Secondary | ICD-10-CM | POA: Diagnosis present

## 2019-04-12 DIAGNOSIS — Z79891 Long term (current) use of opiate analgesic: Secondary | ICD-10-CM | POA: Diagnosis not present

## 2019-04-12 DIAGNOSIS — G40909 Epilepsy, unspecified, not intractable, without status epilepticus: Secondary | ICD-10-CM | POA: Diagnosis present

## 2019-04-12 DIAGNOSIS — M7121 Synovial cyst of popliteal space [Baker], right knee: Secondary | ICD-10-CM | POA: Diagnosis present

## 2019-04-12 DIAGNOSIS — M659 Synovitis and tenosynovitis, unspecified: Secondary | ICD-10-CM | POA: Diagnosis present

## 2019-04-12 DIAGNOSIS — Z96651 Presence of right artificial knee joint: Secondary | ICD-10-CM

## 2019-04-12 DIAGNOSIS — Z8546 Personal history of malignant neoplasm of prostate: Secondary | ICD-10-CM | POA: Diagnosis not present

## 2019-04-12 DIAGNOSIS — M79609 Pain in unspecified limb: Secondary | ICD-10-CM | POA: Diagnosis not present

## 2019-04-12 DIAGNOSIS — Z87820 Personal history of traumatic brain injury: Secondary | ICD-10-CM | POA: Diagnosis not present

## 2019-04-12 DIAGNOSIS — M1711 Unilateral primary osteoarthritis, right knee: Secondary | ICD-10-CM | POA: Diagnosis present

## 2019-04-12 LAB — BASIC METABOLIC PANEL
Anion gap: 9 (ref 5–15)
BUN: 15 mg/dL (ref 8–23)
CO2: 24 mmol/L (ref 22–32)
Calcium: 8.1 mg/dL — ABNORMAL LOW (ref 8.9–10.3)
Chloride: 102 mmol/L (ref 98–111)
Creatinine, Ser: 0.91 mg/dL (ref 0.61–1.24)
GFR calc Af Amer: >60 mL/min (ref >60)
GFR calc non Af Amer: >60 mL/min (ref >60)
Glucose, Bld: 144 mg/dL — ABNORMAL HIGH (ref 70–99)
Potassium: 3.9 mmol/L (ref 3.5–5.1)
Sodium: 135 mmol/L (ref 135–145)

## 2019-04-12 LAB — CBC
HCT: 37.8 % — ABNORMAL LOW (ref 39.0–52.0)
Hemoglobin: 12.7 g/dL — ABNORMAL LOW (ref 13.0–17.0)
MCH: 32.4 pg (ref 26.0–34.0)
MCHC: 33.6 g/dL (ref 30.0–36.0)
MCV: 96.4 fL (ref 80.0–100.0)
Platelets: 180 10*3/uL (ref 150–400)
RBC: 3.92 MIL/uL — ABNORMAL LOW (ref 4.22–5.81)
RDW: 13 % (ref 11.5–15.5)
WBC: 10.2 10*3/uL (ref 4.0–10.5)
nRBC: 0 % (ref 0.0–0.2)

## 2019-04-12 MED ORDER — ASPIRIN 81 MG PO CHEW: 81.0000 mg | CHEWABLE_TABLET | Freq: Two times a day (BID) | ORAL | 1 refills | Status: DC

## 2019-04-12 NOTE — TOC Transition Note (Signed)
Transition of Care [facility]) - CM/SW Discharge Note   Patient Details  Name: Darin Williamson MRN: [id number] Date of Birth: 07-16-1945  [facility] CM/SW Contact:  Leeroy Cha, RN Phone Number: 04/12/2019, 3:39 PM   Clinical Narrative:    [facility] for [facility]   Final next level of care: Home w Home Health Services Barriers to Discharge: No Barriers Identified   Patient Goals and CMS Choice   CMS Medicare.gov Compare Post Acute Care list provided to:: Patient Choice offered to / list presented to : Patient  Discharge Placement                       Discharge Plan and Services   Discharge Planning Services: CM Consult Post Acute Care Choice: Home Health                    HH Arranged: PT [place]: [facility] [employer]) Date [place]: 04/12/19 Time [place]: [place] Representative spoke with at [place]: mike  Social Determinants of Health (Eugenio Saenz) Interventions     Readmission Risk Interventions No flowsheet data found.
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
## 2019-04-12 NOTE — Progress Notes (Signed)
Physical Therapy Treatment Patient Details Name: Darin Williamson MRN: [id number] DOB: 14-May-1945 Today's Date: 04/12/2019    History of Present Illness 74 yo male s/p R TKA 04/11/19    PT Comments    Pt required Mod assist for mobility this morning. Mobility was limited by pain. Pt was not able to meet any PT goals this a.m. Will have a 2nd session to continue assessing progress. Pt may require another night's stay due to lack of progress/unable to meet PT goals, fall risk/safety issues.     Follow Up Recommendations  Follow surgeon's recommendation for DC plan and follow-up therapies     Equipment Recommendations  Rolling walker with 5" wheels;3in1 (PT)    Recommendations for Other Services       Precautions / Restrictions Precautions Precautions: Fall Precaution Comments: high fall risk Restrictions Weight Bearing Restrictions: No    Mobility  Bed Mobility Overal bed mobility: Needs Assistance Bed Mobility: Supine to Sit     Supine to sit: Mod assist;HOB elevated     General bed mobility comments: Assist for trunk and R LE. Pt c/o significant R knee pain and threw himself back onto bed. Cues for safety, technique, breathing. Able to get to EOB with increased time and cueing.  Transfers Overall transfer level: Needs assistance Equipment used: Rolling walker (2 wheeled) Transfers: Sit to/from Stand Sit to Stand: Mod assist         General transfer comment: Assist to rise, stabilize, control descent. Cues for safety, technique, posture, hand/LE placement. Very unsteady.  Ambulation/Gait Ambulation/Gait assistance: Min assist;+2 safety/equipment Gait Distance (Feet): 10 Feet Assistive device: Rolling walker (2 wheeled) Gait Pattern/deviations: Step-to pattern;Trunk flexed;Antalgic;Decreased stance time - right     General Gait Details: VCs safety, technique, sequence, posture, proper use of RW, distance/position from RW, proper step length. Assist to stabilize  pt, manage RW, and follow with recliner. Distance limited by pain. Unsteady. Fall risk.   Stairs             Wheelchair Mobility    Modified Rankin (Stroke Patients Only)       Balance Overall balance assessment: Needs assistance         Standing balance support: Bilateral upper extremity supported Standing balance-Leahy Scale: Poor                              Cognition Arousal/Alertness: Awake/alert Behavior During Therapy: Impulsive Overall Cognitive Status: No family/caregiver present to determine baseline cognitive functioning Area of Impairment: Safety/judgement;Problem solving                         Safety/Judgement: Decreased awareness of safety;Decreased awareness of deficits   Problem Solving: Requires tactile cues;Requires verbal cues;Slow processing        Exercises Total Joint Exercises Ankle Circles/Pumps: AROM;Both;10 reps Quad Sets: AROM;Both;10 reps Heel Slides: AAROM;Right;5 reps Hip ABduction/ADduction: AAROM;Right;10 reps Straight Leg Raises: AAROM;Right;10 reps Knee Flexion: AAROM;Right;10 reps;Seated Goniometric ROM: ~10-65 degrees    General Comments        Pertinent Vitals/Pain Pain Assessment: Faces Faces Pain Scale: Hurts whole lot Pain Location: R knee Pain Descriptors / Indicators: Discomfort;Sore;Aching;Sharp Pain Intervention(s): Limited activity within patient's tolerance;Monitored during session;Ice applied;Repositioned    Home Living                      Prior Function  PT Goals (current goals can now be found in the care plan section) Progress towards PT goals: Progressing toward goals    Frequency    7X/week      PT Plan Current plan remains appropriate    Co-evaluation              AM-PAC PT "6 Clicks" Mobility   Outcome Measure  Help needed turning from your back to your side while in a flat bed without using bedrails?: A Little Help needed moving  from lying on your back to sitting on the side of a flat bed without using bedrails?: A Lot Help needed moving to and from a bed to a chair (including a wheelchair)?: A Little Help needed standing up from a chair using your arms (e.g., wheelchair or bedside chair)?: A Lot Help needed to walk in hospital room?: A Lot Help needed climbing 3-5 steps with a railing? : A Lot 6 Click Score: 14    End of Session Equipment Utilized During Treatment: Gait belt Activity Tolerance: Patient limited by pain Patient left: in chair;with call bell/phone within reach;with chair alarm set   PT Visit Diagnosis: Pain;Other abnormalities of gait and mobility (R26.89) Pain - Right/Left: Right Pain - part of body: Knee     Time: [id number] PT Time Calculation (min) (ACUTE ONLY): 27 min  Charges:  $Gait Training: 8-22 mins $Therapeutic Exercise: 8-22 mins                         Doreatha Massed, PT Acute Rehabilitation

## 2019-04-12 NOTE — Progress Notes (Signed)
Physical Therapy Treatment Patient Details Name: Darin Williamson MRN: [id number] DOB: 1945-10-20 Today's Date: 04/12/2019    History of Present Illness 74 yo male s/p R TKA 04/11/19    PT Comments    Returned again to assist pt back to bed. Increased time and mod encouragement for max effort. Mobility remains limited 2* significant pain. Will continue to progress activity as tolerated.    Follow Up Recommendations  Follow surgeon's recommendation for DC plan and follow-up therapies     Equipment Recommendations  Rolling walker with 5" wheels;3in1 (PT)    Recommendations for Other Services       Precautions / Restrictions Precautions Precautions: Fall Precaution Comments: high fall risk Restrictions Weight Bearing Restrictions: No    Mobility  Bed Mobility Overal bed mobility: Needs Assistance Bed Mobility: Sit to Supine       Sit to supine: Mod assist   General bed mobility comments: Assist for trunk and R LE back onto bed. Increased time.  Transfers Overall transfer level: Needs assistance Equipment used: Rolling walker (2 wheeled) Transfers: Sit to/from [facility] Sit to Stand: Mod assist;+2 physical assistance;+2 safety/equipment Stand pivot transfers: +2 physical assistance;+2 safety/equipment;Min assist       General transfer comment: Several attempts to rise from recliner. VCs safety, technique, hand/LE placement, and for pt to put forth max effort. Stand pivot, recliner to bed, with RW. Increased time. Pt shuffled mostly-uanble to tolerate much weight on R LE.  Ambulation/Gait            Stairs             Wheelchair Mobility    Modified Rankin (Stroke Patients Only)       Balance Overall balance assessment: Needs assistance         Standing balance support: Bilateral upper extremity supported Standing balance-Leahy Scale: Poor                              Cognition Arousal/Alertness:  Awake/alert Behavior During Therapy: WFL for tasks assessed/performed Overall Cognitive Status: No family/caregiver present to determine baseline cognitive functioning Area of Impairment: Safety/judgement;Problem solving                         Safety/Judgement: Decreased awareness of safety   Problem Solving: Slow processing;Requires verbal cues;Requires tactile cues        Exercises      General Comments        Pertinent Vitals/Pain Pain Assessment: Faces Faces Pain Scale: Hurts worst Pain Location: R knee Pain Descriptors / Indicators: Discomfort;Sore;Aching;Sharp Pain Intervention(s): Limited activity within patient's tolerance;Monitored during session;Repositioned    Home Living                      Prior Function            PT Goals (current goals can now be found in the care plan section) Progress towards PT goals: Not progressing toward goals - comment(pain control issues/increased assistance)    Frequency    7X/week      PT Plan Current plan remains appropriate    Co-evaluation              AM-PAC PT "6 Clicks" Mobility   Outcome Measure  Help needed turning from your back to your side while in a flat bed without using bedrails?: A Lot Help needed moving from lying on  your back to sitting on the side of a flat bed without using bedrails?: A Lot Help needed moving to and from a bed to a chair (including a wheelchair)?: A Lot Help needed standing up from a chair using your arms (e.g., wheelchair or bedside chair)?: A Lot Help needed to walk in hospital room?: A Lot Help needed climbing 3-5 steps with a railing? : Total 6 Click Score: 11    End of Session Equipment Utilized During Treatment: Gait belt Activity Tolerance: Patient limited by pain Patient left: in bed;with call bell/phone within reach;with bed alarm set   PT Visit Diagnosis: Pain;Other abnormalities of gait and mobility (R26.89) Pain - Right/Left: Right Pain  - part of body: Knee     Time: [id number] PT Time Calculation (min) (ACUTE ONLY): 10 min  Charges:  $Gait Training: 8-22 mins $Therapeutic Activity: 8-22 mins                         Doreatha Massed, PT Acute Rehabilitation

## 2019-04-12 NOTE — Op Note (Signed)
PATIENT ID: Darin Williamson        MRN:  [id number]          DOB/AGE: 1945/05/25 / 74 y.o.    Joni Fears, MD   Biagio Borg, PA-C [address] [place], [place]  [address]                             [phone]   PROGRESS NOTE  Subjective:  negative for Chest Pain  negative for Shortness of Breath  negative for Nausea/Vomiting   negative for Calf Pain    Tolerating Diet: yes         Patient reports pain as moderate.     Controlled with pain meds  Objective: Vital signs in last 24 hours:    Patient Vitals for the past 24 hrs:  BP Temp Temp src Pulse Resp SpO2 Height Weight  04/12/19 0548 (!) 167/83 -- -- 77 18 97 % -- --  04/12/19 0136 (!) 141/77 98.7 F (37.1 C) Oral 68 16 93 % -- --  04/11/19 2245 (!) 147/75 98.7 F (37.1 C) Oral 72 18 98 % -- --  04/11/19 1358 -- -- -- 63 -- -- -- --  04/11/19 1331 (!) 150/79 97.7 F (36.5 C) Oral (!) 52 15 100 % -- --  04/11/19 1230 (!) 167/88 98.2 F (36.8 C) Oral 63 17 100 % -- --  04/11/19 1150 -- -- -- -- -- -- 5\' 10"  (1.778 m) 89.4 kg  04/11/19 1141 (!) 163/93 98.3 F (36.8 C) -- (!) 50 17 100 % -- --  04/11/19 1030 (!) 157/90 (!) 97.4 F (36.3 C) -- (!) 47 14 100 % -- --  04/11/19 1015 (!) 160/88 -- -- (!) 59 17 98 % -- --  04/11/19 1000 (!) 153/103 -- -- (!) 49 10 97 % -- --  04/11/19 0945 (!) 151/93 -- -- (!) 58 15 97 % -- --  04/11/19 0930 133/87 -- -- 71 14 100 % -- --  04/11/19 0928 123/78 (!) 97.5 F (36.4 C) -- 75 14 100 % -- --      Intake/Output from previous day:   03/16 0701 - 03/17 0700 In: 5308.8 [P.O.:1920; I.V.:2588.8] Out: 2500 [Urine:2500]   Intake/Output this shift:   No intake/output data recorded.   Intake/Output      03/16 0701 - 03/17 0700 03/17 0701 - 03/18 0700   P.O. 1920    I.V. (mL/kg) 2588.8 (29)    IV Piggyback 800    Total Intake(mL/kg) 5308.8 (59.4)    Urine (mL/kg/hr) 2500 (1.2)    Total Output 2500    Net +2808.8            LABORATORY DATA: Recent Labs     04/05/19 1443 04/12/19 0409  WBC 5.6 10.2  HGB 14.1 12.7*  HCT 41.4 37.8*  PLT 221 180   Recent Labs    04/05/19 1443 04/12/19 0409  NA 143 135  K 3.6 3.9  CL 111 102  CO2 27 24  BUN 21 15  CREATININE 0.85 0.91  GLUCOSE 114* 144*  CALCIUM 8.6* 8.1*   Lab Results  Component Value Date   INR 1.0 04/05/2019   INR 1.00 06/22/2013    Recent Radiographic Studies :  DG Chest 2 View  Result Date: 04/06/2019 CLINICAL DATA:  Preop surgery knee replacement, seizure disorder. EXAM: CHEST - 2 VIEW COMPARISON:  03/23/2016 FINDINGS: Cardiomediastinal contours with mild  cardiac enlargement. Hilar structures are unremarkable. Lungs are clear. Mild hyperinflation with flattening of right and left hemidiaphragm. Visualized skeletal structures are unremarkable. IMPRESSION: No acute cardiopulmonary disease. Mild cardiac enlargement. Electronically Signed   By: Zetta Bills M.D.   On: 04/06/2019 08:45   XR KNEE 3 VIEW RIGHT  Result Date: 03/14/2019 Films of the right knee obtained in 3 projections standing.  There is significant narrowing of the medial joint space with near bone-on-bone.  Subchondral sclerosis on both sides of the joint and small peripheral osteophytes.  There are some small osteophytes in the lateral compartment about the patellofemoral joint.  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  No acute changes or ectopic calcification    Examination:  General appearance: alert, cooperative and no distress  Wound Exam: clean, dry, intact   Drainage:  None: wound tissue dry  Motor Exam: EHL, FHL and Anterior Tibial Intact  Sensory Exam: Superficial Peroneal, Deep Peroneal and Tibial normal  Vascular Exam: Normal  Assessment:    1 Day Post-Op  Procedure(s) (LRB): RIGHT TOTAL KNEE ARTHROPLASTY (Right)  ADDITIONAL DIAGNOSIS:  Active Problems:   Total knee replacement status, right  No new problems   Plan: Physical  Therapy as ordered Weight Bearing as Tolerated (WBAT)  DVT Prophylaxis:  Aspirin and TED hose  DISCHARGE PLAN: Home  DISCHARGE NEEDS: HHPT, CPM, Walker and 3-in-1 comode seat  awake alert and oriented-will discharge today. Dressing changed-no drainage. No calf pain  Biagio Borg, PA-C [facility]  04/12/2019 7:45 AM

## 2019-04-12 NOTE — Progress Notes (Signed)
Called ortho care [place] and left message for MD concerning the patients performance with PT.

## 2019-04-12 NOTE — Progress Notes (Addendum)
Physical Therapy Treatment Patient Details Name: Darin Williamson MRN: [id number] DOB: July 02, 1945 Today's Date: 04/12/2019    History of Present Illness 74 yo male s/p R TKA 04/11/19    PT Comments    Pt continues to c/o severe pain in R knee with attempts to mobilize. He required Max assist to stand from the recliner and Mod assist to take a few steps forward. Pt could not progress activity beyond that this afternoon. Assisted back into recliner 2* bed lines soiled with old urine. Made RN and NT aware that pt would require +2 assist back to bed once ready. Pt has not met any PT goals today thus he is not safe to d/c home. Will continue to follow and progress activity as tolerated.     Follow Up Recommendations  Follow surgeon's recommendation for DC plan and follow-up therapies     Equipment Recommendations  Rolling walker with 5" wheels;3in1 (PT)    Recommendations for Other Services       Precautions / Restrictions Precautions Precautions: Fall Precaution Comments: high fall risk Restrictions Weight Bearing Restrictions: No    Mobility  Bed Mobility Overal bed mobility: Needs Assistance           General bed mobility comments: oob in recliner  Transfers Overall transfer level: Needs assistance Equipment used: Rolling walker (2 wheeled) Transfers: Sit to/from Stand Sit to Stand: Max assist         General transfer comment: Several attempts and some encouragement required to stand from recliner. VCs safety, technqiue, hand/LE placement.  Ambulation/Gait Ambulation/Gait assistance: Mod assist Gait Distance (Feet): 3 Feet Assistive device: Rolling walker (2 wheeled) Gait Pattern/deviations: Step-to pattern;Trunk flexed;Antalgic;Decreased stance time - right     General Gait Details: VCs safety, technique, sequence, posture, proper use of RW, distance/position from RW, proper step length. Assist to stabilize pt, manage RW, and follow with recliner. Distance  limited by pain. Unsteady. Fall risk.   Stairs             Wheelchair Mobility    Modified Rankin (Stroke Patients Only)       Balance Overall balance assessment: Needs assistance         Standing balance support: Bilateral upper extremity supported Standing balance-Leahy Scale: Poor                              Cognition Arousal/Alertness: Awake/alert Behavior During Therapy: WFL for tasks assessed/performed Overall Cognitive Status: No family/caregiver present to determine baseline cognitive functioning Area of Impairment: Safety/judgement;Problem solving                         Safety/Judgement: Decreased awareness of safety   Problem Solving: Slow processing;Requires verbal cues;Requires tactile cues        Exercises     General Comments        Pertinent Vitals/Pain Pain Assessment: Faces Faces Pain Scale: Hurts worst Pain Location: R knee Pain Descriptors / Indicators: Discomfort;Sore;Aching;Sharp Pain Intervention(s): Limited activity within patient's tolerance;Repositioned    Home Living                      Prior Function            PT Goals (current goals can now be found in the care plan section) Progress towards PT goals: Not progressing toward goals - comment(limited by pain)    Frequency  7X/week      PT Plan Current plan remains appropriate    Co-evaluation              AM-PAC PT "6 Clicks" Mobility   Outcome Measure  Help needed turning from your back to your side while in a flat bed without using bedrails?: A Little Help needed moving from lying on your back to sitting on the side of a flat bed without using bedrails?: A Lot Help needed moving to and from a bed to a chair (including a wheelchair)?: A Lot Help needed standing up from a chair using your arms (e.g., wheelchair or bedside chair)?: A Lot Help needed to walk in hospital room?: A Lot Help needed climbing 3-5 steps with  a railing? : Total 6 Click Score: 12    End of Session Equipment Utilized During Treatment: Gait belt Activity Tolerance: Patient limited by pain Patient left: in chair;with call bell/phone within reach;with chair alarm set   PT Visit Diagnosis: Pain;Other abnormalities of gait and mobility (R26.89) Pain - Right/Left: Right Pain - part of body: Knee     Time: 1275-1700 PT Time Calculation (min) (ACUTE ONLY): 13 min  Charges:  $Gait Training: 8-22 mins $Therapeutic Exercise: 8-22 mins                        Doreatha Massed, PT Acute Rehabilitation

## 2019-04-13 LAB — BASIC METABOLIC PANEL
Anion gap: 9 (ref 5–15)
BUN: 15 mg/dL (ref 8–23)
CO2: 24 mmol/L (ref 22–32)
Calcium: 8.4 mg/dL — ABNORMAL LOW (ref 8.9–10.3)
Chloride: 99 mmol/L (ref 98–111)
Creatinine, Ser: 0.86 mg/dL (ref 0.61–1.24)
GFR calc Af Amer: >60 mL/min (ref >60)
GFR calc non Af Amer: >60 mL/min (ref >60)
Glucose, Bld: 128 mg/dL — ABNORMAL HIGH (ref 70–99)
Potassium: 3.5 mmol/L (ref 3.5–5.1)
Sodium: 132 mmol/L — ABNORMAL LOW (ref 135–145)

## 2019-04-13 LAB — CBC
HCT: 39.9 % (ref 39.0–52.0)
Hemoglobin: 13.5 g/dL (ref 13.0–17.0)
MCH: 32.5 pg (ref 26.0–34.0)
MCHC: 33.8 g/dL (ref 30.0–36.0)
MCV: 95.9 fL (ref 80.0–100.0)
Platelets: 153 10*3/uL (ref 150–400)
RBC: 4.16 MIL/uL — ABNORMAL LOW (ref 4.22–5.81)
RDW: 12.7 % (ref 11.5–15.5)
WBC: 13.1 10*3/uL — ABNORMAL HIGH (ref 4.0–10.5)
nRBC: 0 % (ref 0.0–0.2)

## 2019-04-13 MED ORDER — ACETAMINOPHEN 325 MG PO TABS: 325.0000 mg | ORAL_TABLET | ORAL | Status: DC

## 2019-04-13 MED ORDER — ACETAMINOPHEN 325 MG PO TABS: 650.0000 mg | ORAL_TABLET | ORAL | 2 refills | Status: DC | PRN
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
## 2019-04-13 NOTE — TOC Progression Note (Addendum)
Transition of Care [facility]) - Progression Note    Patient Details  Name: Darin Williamson MRN: [id number] Date of Birth: 18-Jun-1945  Transition of Care [facility]) CM/SW Contact  Leeroy Cha, RN Phone Number: 04/13/2019, 2:03 PM  Clinical Narrative:    Patient has decided that a snf would be better for post hospital care md agrees to plan.  Fl2 sent out to area.  Would prefer the [facility].   Insurance auth started with [facility] with [facility].  Expected Discharge Plan: Skilled Nursing Facility Barriers to Discharge: SNF Pending bed offer  Expected Discharge Plan and Services Expected Discharge Plan: [place]   Discharge Planning Services: CM Consult Post Acute Care Choice: [place] Living arrangements for the past 2 months: Single Family Home Expected Discharge Date: 04/13/19                         HH Arranged: PT [place]: [facility] [employer] Date [place]: 04/12/19 Time [place]: [place] Representative spoke with at [place]: mike   Social Determinants of Health ([place]) Interventions    Readmission Risk Interventions No flowsheet data found.

## 2019-04-13 NOTE — NC FL2 (Signed)
[place] LEVEL OF CARE SCREENING TOOL     IDENTIFICATION  Patient Name: Darin Williamson Birthdate: 30-Aug-1945 Sex: male Admission Date (Current Location): 04/11/2019  [facility] and [place] Number:  Herbalist and Address:  [facility],  [place], [place]      Provider Number: [id number]  Attending Physician Name and Address:  Garald Balding, MD  Relative Name and Phone Number:       Current Level of Care: Hospital Recommended Level of Care: [place] Prior Approval Number:    Date Approved/Denied:   PASRR Number: [id number]  Discharge Plan: SNF    Current Diagnoses: Patient Active Problem List   Diagnosis Date Noted  . S/P total knee arthroplasty, 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  . Total knee replacement status, right 04/11/2019  . Unilateral primary osteoarthritis, right knee 03/14/2019  . Malignant neoplasm of prostate ([place]) 05/11/2013  . Screening for colon cancer 10/28/2010    Orientation RESPIRATION BLADDER Height & Weight     Self, Time, Situation, Place  Normal Continent Weight: 89.4 kg Height:  5\' 10"  (177.8 cm)  BEHAVIORAL SYMPTOMS/MOOD NEUROLOGICAL BOWEL NUTRITION STATUS      Continent Diet(regular)  AMBULATORY STATUS COMMUNICATION OF NEEDS Skin   Extensive Assist Verbally Normal                       Personal Care Assistance Level of Assistance  Bathing, Feeding, Dressing Bathing Assistance: Limited assistance Feeding assistance: Limited assistance Dressing Assistance: Limited assistance     Functional Limitations Info  Sight, Hearing, Speech Sight Info: Adequate Hearing Info: Adequate Speech Info: Adequate    SPECIAL CARE FACTORS FREQUENCY  PT (By licensed PT)     PT Frequency: 5xweekly              Contractures Contractures Info: Not present    Additional Factors Info  Code Status Code Status Info: full             Current Medications (04/13/2019):  This is  the current hospital active medication list Current Facility-Administered Medications  Medication Dose Route Frequency Provider Last Rate Last Admin  . 0.9 %  sodium chloride infusion  75 mL/hr Intravenous Continuous Cherylann Ratel, PA-C   Stopped at 04/12/19 [id number]  . alum & mag hydroxide-simeth (MAALOX/MYLANTA) 200-200-20 MG/5ML suspension 30 mL  30 mL Oral Q4H PRN Petrarca, Brian D, PA-C      . amLODipine (NORVASC) tablet 5 mg  5 mg Oral Daily Cherylann Ratel, PA-C   5 mg at 04/13/19 [id number]  . aspirin chewable tablet 81 mg  81 mg Oral BID Cherylann Ratel, PA-C   81 mg at 04/13/19 [id number]  . bisacodyl (DULCOLAX) suppository 10 mg  10 mg Rectal Daily PRN Cherylann Ratel, PA-C      . diphenhydrAMINE (BENADRYL) 12.5 MG/5ML elixir 12.5-25 mg  12.5-25 mg Oral Q4H PRN Biagio Borg D, PA-C      . docusate sodium (COLACE) capsule 100 mg  100 mg Oral BID Biagio Borg D, PA-C   100 mg at 04/13/19 [id number]  . HYDROmorphone (DILAUDID) injection 0.5-1 mg  0.5-1 mg Intravenous Q3H PRN Garald Balding, MD   1 mg at 04/12/19 1608  . magnesium citrate solution 1 Bottle  1 Bottle Oral Once PRN Biagio Borg D, PA-C      . magnesium hydroxide (MILK OF MAGNESIA) suspension 30 mL  30 mL Oral Daily PRN Cherylann Ratel,  PA-C   30 mL at 04/13/19 0926  . menthol-cetylpyridinium (CEPACOL) lozenge 3 mg  1 lozenge Oral PRN Biagio Borg D, PA-C       Or  . phenol (CHLORASEPTIC) mouth spray 1 spray  1 spray Mouth/Throat PRN Petrarca, Mike Craze, PA-C      . methocarbamol (ROBAXIN) tablet 500 mg  500 mg Oral Q6H PRN Cherylann Ratel, PA-C   500 mg at 04/13/19 [id number]   Or  . methocarbamol (ROBAXIN) 500 mg in dextrose 5 % 50 mL IVPB  500 mg Intravenous Q6H PRN Petrarca, Brian D, PA-C      . metoCLOPramide (REGLAN) tablet 5-10 mg  5-10 mg Oral Q8H PRN Biagio Borg D, PA-C       Or  . metoCLOPramide (REGLAN) injection 5-10 mg  5-10 mg Intravenous Q8H PRN Petrarca, Brian D, PA-C      . ondansetron (ZOFRAN) tablet 4  mg  4 mg Oral Q6H PRN Biagio Borg D, PA-C       Or  . ondansetron (ZOFRAN) injection 4 mg  4 mg Intravenous Q6H PRN Biagio Borg D, PA-C      . oxyCODONE (Oxy IR/ROXICODONE) immediate release tablet 5-10 mg  5-10 mg Oral Q3H PRN Garald Balding, MD   5 mg at 04/13/19 [id number]  . phenytoin (DILANTIN) ER capsule 200 mg  200 mg Oral Daily Garald Balding, MD   200 mg at 04/13/19 [id number]   And  . phenytoin (DILANTIN) ER capsule 100 mg  100 mg Oral QHS Garald Balding, MD   100 mg at 04/12/19 2012     Discharge Medications: Please see discharge summary for a list of discharge medications.  Relevant Imaging Results:  Relevant Lab Results:   Additional Information [id number]  Leeroy Cha, RN

## 2019-04-13 NOTE — Op Note (Signed)
PATIENT ID: JAHMIRE KOH        MRN:  [id number]          DOB/AGE: 72-10-1945 / 72 y.o.    Joni Fears, MD   Biagio Borg, PA-C [address] [place], [place]  [address]                             [phone]   PROGRESS NOTE  Subjective:  negative for Chest Pain  negative for Shortness of Breath  negative for Nausea/Vomiting   negative for Calf Pain    Tolerating Diet: yes         Patient reports pain as moderate.     Difficult time with pain and tolerating PT requiring in patient status change-seems better this am  Objective: Vital signs in last 24 hours:    Patient Vitals for the past 24 hrs:  BP Temp Temp src Pulse Resp SpO2  04/13/19 0900 (!) 150/81 98.7 F (37.1 C) Oral (!) 113 17 96 %  04/13/19 0539 (!) 149/77 98.1 F (36.7 C) Oral 79 16 92 %  04/13/19 0150 (!) 160/83 98.2 F (36.8 C) Oral 93 18 96 %  04/12/19 2202 (!) 155/79 98.1 F (36.7 C) Oral 84 16 98 %  04/12/19 1816 (!) 171/82 99.2 F (37.3 C) Oral 88 18 99 %  04/12/19 1332 (!) 157/85 98.7 F (37.1 C) Oral 88 17 98 %      Intake/Output from previous day:   03/17 0701 - 03/18 0700 In: 1103.8 [P.O.:900; I.V.:203.8] Out: 1375 [id number]   Intake/Output this shift:   03/18 0701 - 03/18 1900 In: 100 [P.O.:100] Out: 300 [Urine:300]   Intake/Output      03/17 0701 - 03/18 0700 03/18 0701 - 03/19 0700   P.O. 900 100   I.V. (mL/kg) 203.8 (2.3)    IV Piggyback     Total Intake(mL/kg) 1103.8 (12.3) 100 (1.1)   Urine (mL/kg/hr) 1375 (0.6) 300 (0.9)   Total Output 1375 300   Net -271.3 -200        Urine Occurrence 2 x       LABORATORY DATA: Recent Labs    04/12/19 0409 04/13/19 0233  WBC 10.2 13.1*  HGB 12.7* 13.5  HCT 37.8* 39.9  PLT 180 153   Recent Labs    04/12/19 0409 04/13/19 0233  NA 135 132*  K 3.9 3.5  CL 102 99  CO2 24 24  BUN 15 15  CREATININE 0.91 0.86  GLUCOSE 144* 128*  CALCIUM 8.1* 8.4*   Lab Results  Component Value Date   INR 1.0 04/05/2019   INR  1.00 06/22/2013    Recent Radiographic Studies :  DG Chest 2 View  Result Date: 04/06/2019 CLINICAL DATA:  Preop surgery knee replacement, seizure disorder. EXAM: CHEST - 2 VIEW COMPARISON:  03/23/2016 FINDINGS: Cardiomediastinal contours with mild cardiac enlargement. Hilar structures are unremarkable. Lungs are clear. Mild hyperinflation with flattening of right and left hemidiaphragm. Visualized skeletal structures are unremarkable. IMPRESSION: No acute cardiopulmonary disease. Mild cardiac enlargement. Electronically Signed   By: Zetta Bills M.D.   On: 04/06/2019 08:45   XR KNEE 3 VIEW RIGHT  Result Date: 03/14/2019 Films of the right knee obtained in 3 projections standing.  There is significant narrowing of the medial joint space with near bone-on-bone.  Subchondral sclerosis on both sides of the joint and small peripheral osteophytes.  There are some small osteophytes in  the lateral compartment about the patellofemoral joint.  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  No acute changes or ectopic calcification    Examination:  General appearance: alert, cooperative and no distress  Wound Exam: clean, dry, intact   Drainage:  Scant/small amount Serosanguinous exudate  Motor Exam: EHL, FHL and Anterior Tibial Intact  Sensory Exam: Superficial Peroneal, Deep Peroneal and Tibial normal  Vascular Exam: Normal  Assessment:    2 Days Post-Op  Procedure(s) (LRB): RIGHT TOTAL KNEE ARTHROPLASTY (Right)  ADDITIONAL DIAGNOSIS:  Active Problems:   Total knee replacement status, right   S/P total knee arthroplasty, right     Plan: Physical Therapy as ordered Weight Bearing as Tolerated (WBAT)  DVT Prophylaxis:  Aspirin and TED hose  DISCHARGE PLAN: Home  DISCHARGE NEEDS: HHPT, CPM, Walker and 3-in-1 comode seat  Anticipated LOS equal to or greater than 2 midnights due to - Age 72 and older with one or more of the following:   - Obesity  - Expected need for hospital services (PT, OT, Nursing) required for safe  discharge  - Anticipated need for postoperative skilled nursing care or inpatient rehab  - Active co-morbidities: Chronic pain requiring opiods OR   - Unanticipated findings during/Post Surgery: Slow post-op progression: GI, pain control, mobility  - Patient is a high risk of re-admission due to: None     Difficulty with pain and slow progress in PT- long discussion with Mr Monge and his brother re pain and need to get up and out of bed. Will discharge today with possible ambulance transfer to home. Brother will participate in PT session this pm   Biagio Borg, [place]  04/13/2019 10:46 AM
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
## 2019-04-14 ENCOUNTER — Inpatient Hospital Stay (HOSPITAL_COMMUNITY): Payer: PPO

## 2019-04-14 ENCOUNTER — Inpatient Hospital Stay
Admission: RE | Admit: 2019-04-14 | Discharge: 2019-05-03 | Disposition: A | Discharge status: Home / Self Care | Payer: PPO | Source: Ambulatory Visit | Attending: Internal Medicine | Admitting: Internal Medicine

## 2019-04-14 DIAGNOSIS — Z743 Need for continuous supervision: Secondary | ICD-10-CM | POA: Diagnosis not present

## 2019-04-14 DIAGNOSIS — Z96651 Presence of right artificial knee joint: Secondary | ICD-10-CM | POA: Diagnosis not present

## 2019-04-14 DIAGNOSIS — M1711 Unilateral primary osteoarthritis, right knee: Secondary | ICD-10-CM | POA: Diagnosis not present

## 2019-04-14 DIAGNOSIS — R279 Unspecified lack of coordination: Secondary | ICD-10-CM | POA: Diagnosis not present

## 2019-04-14 DIAGNOSIS — Z96661 Presence of right artificial ankle joint: Secondary | ICD-10-CM | POA: Diagnosis not present

## 2019-04-14 DIAGNOSIS — G40909 Epilepsy, unspecified, not intractable, without status epilepticus: Secondary | ICD-10-CM | POA: Diagnosis not present

## 2019-04-14 DIAGNOSIS — Z973 Presence of spectacles and contact lenses: Secondary | ICD-10-CM | POA: Diagnosis not present

## 2019-04-14 DIAGNOSIS — R569 Unspecified convulsions: Secondary | ICD-10-CM | POA: Diagnosis not present

## 2019-04-14 DIAGNOSIS — Z471 Aftercare following joint replacement surgery: Secondary | ICD-10-CM | POA: Diagnosis not present

## 2019-04-14 DIAGNOSIS — M79609 Pain in unspecified limb: Secondary | ICD-10-CM

## 2019-04-14 DIAGNOSIS — R410 Disorientation, unspecified: Secondary | ICD-10-CM | POA: Diagnosis not present

## 2019-04-14 DIAGNOSIS — R351 Nocturia: Secondary | ICD-10-CM | POA: Diagnosis not present

## 2019-04-14 DIAGNOSIS — Z741 Need for assistance with personal care: Secondary | ICD-10-CM | POA: Diagnosis not present

## 2019-04-14 DIAGNOSIS — M6281 Muscle weakness (generalized): Secondary | ICD-10-CM | POA: Diagnosis not present

## 2019-04-14 DIAGNOSIS — R262 Difficulty in walking, not elsewhere classified: Secondary | ICD-10-CM | POA: Diagnosis not present

## 2019-04-14 DIAGNOSIS — R5381 Other malaise: Secondary | ICD-10-CM | POA: Diagnosis not present

## 2019-04-14 DIAGNOSIS — C61 Malignant neoplasm of prostate: Secondary | ICD-10-CM | POA: Diagnosis not present

## 2019-04-14 DIAGNOSIS — I10 Essential (primary) hypertension: Secondary | ICD-10-CM | POA: Diagnosis not present

## 2019-04-14 LAB — BASIC METABOLIC PANEL
Anion gap: 9 (ref 5–15)
BUN: 19 mg/dL (ref 8–23)
CO2: 24 mmol/L (ref 22–32)
Calcium: 8.2 mg/dL — ABNORMAL LOW (ref 8.9–10.3)
Chloride: 101 mmol/L (ref 98–111)
Creatinine, Ser: 0.88 mg/dL (ref 0.61–1.24)
GFR calc Af Amer: >60 mL/min (ref >60)
GFR calc non Af Amer: >60 mL/min (ref >60)
Glucose, Bld: 116 mg/dL — ABNORMAL HIGH (ref 70–99)
Potassium: 3.5 mmol/L (ref 3.5–5.1)
Sodium: 134 mmol/L — ABNORMAL LOW (ref 135–145)

## 2019-04-14 LAB — SARS CORONAVIRUS 2 (TAT 6-24 HRS): SARS Coronavirus 2: NEGATIVE

## 2019-04-14 LAB — CBC
HCT: 32.8 % — ABNORMAL LOW (ref 39.0–52.0)
Hemoglobin: 11.1 g/dL — ABNORMAL LOW (ref 13.0–17.0)
MCH: 32.6 pg (ref 26.0–34.0)
MCHC: 33.8 g/dL (ref 30.0–36.0)
MCV: 96.2 fL (ref 80.0–100.0)
Platelets: 162 10*3/uL (ref 150–400)
RBC: 3.41 MIL/uL — ABNORMAL LOW (ref 4.22–5.81)
RDW: 13 % (ref 11.5–15.5)
WBC: 11 10*3/uL — ABNORMAL HIGH (ref 4.0–10.5)
nRBC: 0 % (ref 0.0–0.2)

## 2019-04-14 NOTE — Progress Notes (Addendum)
Right leg hot, swollen, painful to touch with slower capillary refill. Paged Dr. Durward Fortes.

## 2019-04-14 NOTE — TOC Progression Note (Addendum)
Transition of Care [facility]) - Progression Note    Patient Details  Name: Darin Williamson MRN: [id number] Date of Birth: 12-07-1945  [facility] CM/SW Contact  Leeroy Cha, RN Phone Number: 04/14/2019, 8:24 AM  Clinical Narrative:    tcf-Tammy with [facility] for snf given number [phone] for 7 days thern review ptar Josem Kaufmann is [phone] [facility] accrpt patient today. Expected Discharge Plan: Skilled Nursing Facility Barriers to Discharge: SNF Pending bed offer  Expected Discharge Plan and Services Expected Discharge Plan: [place]   Discharge Planning Services: CM Consult Post Acute Care Choice: [place] Living arrangements for the past 2 months: Single Family Home Expected Discharge Date: 04/13/19                         HH Arranged: PT [place]: [facility] [employer] Date [place]: 04/12/19 Time [place]: [place] Representative spoke with at [place]: mike   Social Determinants of Health ([place]) Interventions    Readmission Risk Interventions No flowsheet data found.

## 2019-04-14 NOTE — Progress Notes (Signed)
Lower extremity venous has been completed.   Preliminary results in CV Proc.   Abram Sander 04/14/2019 9:11 AM
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
## 2019-04-14 NOTE — Op Note (Signed)
PATIENT ID: AZARION NORBY        MRN:  [id number]          DOB/AGE: 72/72/1947 / 72 y.o.    Joni Fears, MD   Biagio Borg, PA-C [address] [place], [place]  [address]                             [phone]   PROGRESS NOTE  Subjective:  negative for Chest Pain  negative for Shortness of Breath  negative for Nausea/Vomiting   negative for Calf Pain    Tolerating Diet: yes         Patient reports pain as moderate.       Objective: Vital signs in last 24 hours:    Patient Vitals for the past 24 hrs:  BP Temp Temp src Pulse Resp SpO2  04/14/19 0941 122/78 98.3 F (36.8 C) Oral (!) 101 16 93 %  04/14/19 0527 (!) 141/61 98.8 F (37.1 C) Oral 92 18 97 %  04/14/19 0147 132/76 98.6 F (37 C) Oral 93 18 97 %  04/13/19 2102 (!) 152/83 98.4 F (36.9 C) Oral (!) 107 18 95 %  04/13/19 1810 (!) 148/82 98.1 F (36.7 C) Oral 99 18 96 %  04/13/19 1656 (!) 142/75 98.3 F (36.8 C) Oral (!) 105 18 99 %  04/13/19 1322 136/87 98.7 F (37.1 C) Oral (!) 103 18 94 %      Intake/Output from previous day:   03/18 0701 - 03/19 0700 In: 200 [P.O.:200] Out: 1450 [Urine:1450]   Intake/Output this shift:   03/19 0701 - 03/19 1900 In: 240 [P.O.:240] Out: 150 [Urine:150]   Intake/Output      03/18 0701 - 03/19 0700 03/19 0701 - 03/20 0700   P.O. 200 240   I.V. (mL/kg)     Total Intake(mL/kg) 200 (2.2) 240 (2.7)   Urine (mL/kg/hr) 1450 (0.7) 150 (0.4)   Stool  0   Total Output 1450 150   Net -1250 +90        Stool Occurrence  1 x      LABORATORY DATA: Recent Labs    04/12/19 0409 04/13/19 0233 04/14/19 0230  WBC 10.2 13.1* 11.0*  HGB 12.7* 13.5 11.1*  HCT 37.8* 39.9 32.8*  PLT 180 153 162   Recent Labs    04/12/19 0409 04/13/19 0233 04/14/19 0230  NA 135 132* 134*  K 3.9 3.5 3.5  CL 102 99 101  CO2 24 24 24   BUN 15 15 19   CREATININE 0.91 0.86 0.88  GLUCOSE 144* 128* 116*  CALCIUM 8.1* 8.4* 8.2*   Lab Results  Component Value Date   INR 1.0  04/05/2019   INR 1.00 06/22/2013    Recent Radiographic Studies :  DG Chest 2 View  Result Date: 04/06/2019 CLINICAL DATA:  Preop surgery knee replacement, seizure disorder. EXAM: CHEST - 2 VIEW COMPARISON:  03/23/2016 FINDINGS: Cardiomediastinal contours with mild cardiac enlargement. Hilar structures are unremarkable. Lungs are clear. Mild hyperinflation with flattening of right and left hemidiaphragm. Visualized skeletal structures are unremarkable. IMPRESSION: No acute cardiopulmonary disease. Mild cardiac enlargement. Electronically Signed   By: Zetta Bills M.D.   On: 04/06/2019 08:45   VAS [place] LOWER EXTREMITY VENOUS (DVT)  Result Date: 04/14/2019  Lower Venous DVTStudy Indications: Leg hot, painful to touch, capillary refill slow.  Limitations: Pain tolerance and poor ultrasound/tissue interface. Comparison Study: no prior Performing Technologist: Abram Sander  RVS  Examination Guidelines: A complete evaluation includes B-mode imaging, spectral Doppler, color Doppler, and power Doppler as needed of all accessible portions of each vessel. Bilateral testing is considered an integral part of a complete examination. Limited examinations for reoccurring indications may be performed as noted. The reflux portion of the exam is performed with the patient in reverse Trendelenburg.  +---------+---------------+---------+-----------+----------+------------------+ RIGHT    CompressibilityPhasicitySpontaneityPropertiesThrombus Aging     +---------+---------------+---------+-----------+----------+------------------+ CFV      Full           Yes      Yes                                     +---------+---------------+---------+-----------+----------+------------------+ SFJ      Full                                                            +---------+---------------+---------+-----------+----------+------------------+ FV Prox  Full                                                             +---------+---------------+---------+-----------+----------+------------------+ FV Mid                  Yes      Yes                                     +---------+---------------+---------+-----------+----------+------------------+ FV Distal                                             Not visualized     +---------+---------------+---------+-----------+----------+------------------+ PFV      Full                                                            +---------+---------------+---------+-----------+----------+------------------+ POP      Full           Yes      Yes                  limited                                                                  visualization      +---------+---------------+---------+-----------+----------+------------------+ PTV      Full                                                            +---------+---------------+---------+-----------+----------+------------------+  PERO     Full                                         limited                                                                  visualization      +---------+---------------+---------+-----------+----------+------------------+   +----+---------------+---------+-----------+----------+--------------+ LEFTCompressibilityPhasicitySpontaneityPropertiesThrombus Aging +----+---------------+---------+-----------+----------+--------------+ CFV Full           Yes      Yes                                 +----+---------------+---------+-----------+----------+--------------+     Summary: RIGHT: - There is no evidence of deep vein thrombosis in the lower extremity. However, portions of this examination were limited- see technologist comments above.  - No cystic structure found in the popliteal fossa.  LEFT: - No evidence of common femoral vein obstruction.  *See table(s) above for measurements and observations.    Preliminary      Examination:  General  appearance: alert, cooperative and no distress  Wound Exam: clean, dry, intact   Drainage:  None: wound tissue dry  Motor Exam: EHL, FHL and Anterior Tibial Intact  Sensory Exam: Superficial Peroneal, Deep Peroneal and Tibial normal  Vascular Exam: Normal  Assessment:    3 Days Post-Op  Procedure(s) (LRB): RIGHT TOTAL KNEE ARTHROPLASTY (Right)  ADDITIONAL DIAGNOSIS:  Active Problems:   Total knee replacement status, right   S/P total knee arthroplasty, right     Plan: Physical Therapy as ordered Weight Bearing as Tolerated (WBAT)  DVT Prophylaxis:  Aspirin and TED hose  DISCHARGE PLAN: Skilled Nursing Facility/Rehab  DISCHARGE NEEDS: CPM, Walker and 3-in-1 comode seat  Anticipated LOS equal to or greater than 2 midnights due to - Age 47 and older with one or more of the following:  - Obesity  - Expected need for hospital services (PT, OT, Nursing) required for safe  discharge  - Anticipated need for postoperative skilled nursing care or inpatient rehab  - Active co-morbidities: Chronic pain requiring opiods OR   - Unanticipated findings during/Post Surgery: Slow post-op progression: GI, pain control, mobility  - Patient is a high risk of re-admission due to: Barriers to post-acute care (logistical, no family support in home)   Doppler study neg for DVT. Covid test neg. Going to [facility] in [place] for rehab today. VS stable. Swelling in RLE is abating-one stable blister in proximal leg-covered. No calf pain.Better effort in PT. Family limited in ability to care given his slow progress.     Biagio Borg, PA-C [place]  04/14/2019 11:39 AM
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
## 2019-04-14 NOTE — Plan of Care (Signed)
Patient discharging to [facility]. [facility] called at 1:41pm, he is waiting on them.
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
## 2019-04-14 NOTE — Care Management Important Message (Signed)
Important Message  Patient Details IM Letter given to [name] RN Case Manager to present to the Patient Name: Darin Williamson MRN: [id number] Date of Birth: Feb 13, 1945   Medicare Important Message Given:  Yes     Kerin Salen 04/14/2019, 12:04 PM

## 2019-04-17 ENCOUNTER — Non-Acute Institutional Stay (SKILLED_NURSING_FACILITY): Payer: PPO | Admitting: Adult Health

## 2019-04-17 ENCOUNTER — Encounter: Payer: Self-pay | Admitting: Adult Health

## 2019-04-17 DIAGNOSIS — Z96651 Presence of right artificial knee joint: Secondary | ICD-10-CM

## 2019-04-17 DIAGNOSIS — G40909 Epilepsy, unspecified, not intractable, without status epilepticus: Secondary | ICD-10-CM | POA: Diagnosis not present

## 2019-04-17 DIAGNOSIS — M1711 Unilateral primary osteoarthritis, right knee: Secondary | ICD-10-CM

## 2019-04-17 DIAGNOSIS — I10 Essential (primary) hypertension: Secondary | ICD-10-CM | POA: Diagnosis not present

## 2019-04-17 DIAGNOSIS — C61 Malignant neoplasm of prostate: Secondary | ICD-10-CM | POA: Diagnosis not present
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
## 2019-04-18 ENCOUNTER — Other Ambulatory Visit: Payer: Self-pay | Admitting: Adult Health

## 2019-04-18 MED ORDER — OXYCODONE HCL 5 MG PO TABS: 5.0000 mg | ORAL_TABLET | ORAL | 0 refills | Status: DC | PRN

## 2019-04-19 ENCOUNTER — Non-Acute Institutional Stay (SKILLED_NURSING_FACILITY): Payer: PPO | Admitting: Internal Medicine

## 2019-04-19 ENCOUNTER — Encounter: Payer: Self-pay | Admitting: Internal Medicine

## 2019-04-19 DIAGNOSIS — Z96651 Presence of right artificial knee joint: Secondary | ICD-10-CM | POA: Diagnosis not present

## 2019-04-19 DIAGNOSIS — M1711 Unilateral primary osteoarthritis, right knee: Secondary | ICD-10-CM | POA: Diagnosis not present

## 2019-04-19 DIAGNOSIS — G40909 Epilepsy, unspecified, not intractable, without status epilepticus: Secondary | ICD-10-CM | POA: Diagnosis not present

## 2019-04-19 DIAGNOSIS — I10 Essential (primary) hypertension: Secondary | ICD-10-CM

## 2019-04-19 DIAGNOSIS — C61 Malignant neoplasm of prostate: Secondary | ICD-10-CM | POA: Diagnosis not present
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
## 2019-04-20 ENCOUNTER — Non-Acute Institutional Stay (SKILLED_NURSING_FACILITY): Payer: PPO | Admitting: Adult Health

## 2019-04-20 DIAGNOSIS — M1711 Unilateral primary osteoarthritis, right knee: Secondary | ICD-10-CM

## 2019-04-20 DIAGNOSIS — Z96651 Presence of right artificial knee joint: Secondary | ICD-10-CM | POA: Diagnosis not present

## 2019-04-22 ENCOUNTER — Encounter: Payer: Self-pay | Admitting: Internal Medicine

## 2019-04-24 ENCOUNTER — Encounter: Payer: Self-pay | Admitting: Adult Health

## 2019-04-24 ENCOUNTER — Non-Acute Institutional Stay (SKILLED_NURSING_FACILITY): Payer: PPO | Admitting: Adult Health

## 2019-04-24 DIAGNOSIS — M1711 Unilateral primary osteoarthritis, right knee: Secondary | ICD-10-CM

## 2019-04-24 DIAGNOSIS — I10 Essential (primary) hypertension: Secondary | ICD-10-CM

## 2019-04-24 DIAGNOSIS — G40909 Epilepsy, unspecified, not intractable, without status epilepticus: Secondary | ICD-10-CM
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
## 2019-04-25 ENCOUNTER — Ambulatory Visit (INDEPENDENT_AMBULATORY_CARE_PROVIDER_SITE_OTHER): Payer: PPO | Admitting: Orthopaedic Surgery

## 2019-04-25 ENCOUNTER — Encounter: Payer: Self-pay | Admitting: Orthopaedic Surgery

## 2019-04-25 ENCOUNTER — Inpatient Hospital Stay (INDEPENDENT_AMBULATORY_CARE_PROVIDER_SITE_OTHER): Payer: PPO

## 2019-04-25 VITALS — Ht 70.0 in | Wt 192.0 lb

## 2019-04-25 DIAGNOSIS — Z96651 Presence of right artificial knee joint: Secondary | ICD-10-CM

## 2019-04-25 DIAGNOSIS — M1711 Unilateral primary osteoarthritis, right knee: Secondary | ICD-10-CM
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
## 2019-05-03 ENCOUNTER — Non-Acute Institutional Stay (SKILLED_NURSING_FACILITY): Payer: PPO | Admitting: Adult Health

## 2019-05-03 ENCOUNTER — Other Ambulatory Visit: Payer: Self-pay | Admitting: Adult Health

## 2019-05-03 ENCOUNTER — Encounter: Payer: Self-pay | Admitting: Adult Health

## 2019-05-03 DIAGNOSIS — M1711 Unilateral primary osteoarthritis, right knee: Secondary | ICD-10-CM | POA: Diagnosis not present

## 2019-05-03 DIAGNOSIS — G40909 Epilepsy, unspecified, not intractable, without status epilepticus: Secondary | ICD-10-CM | POA: Diagnosis not present

## 2019-05-03 DIAGNOSIS — I10 Essential (primary) hypertension: Secondary | ICD-10-CM

## 2019-05-03 MED ORDER — METHOCARBAMOL 500 MG PO TABS: 500.0000 mg | ORAL_TABLET | Freq: Three times a day (TID) | ORAL | 0 refills | Status: DC | PRN

## 2019-05-03 MED ORDER — AMLODIPINE BESYLATE 5 MG PO TABS: 5.0000 mg | ORAL_TABLET | Freq: Every day | ORAL | 0 refills | Status: DC

## 2019-05-03 MED ORDER — PHENYTOIN SODIUM EXTENDED 100 MG PO CAPS: ORAL_CAPSULE | ORAL | 0 refills | Status: AC
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
## 2019-05-09 ENCOUNTER — Other Ambulatory Visit: Payer: Self-pay

## 2019-05-09 DIAGNOSIS — G40909 Epilepsy, unspecified, not intractable, without status epilepticus: Secondary | ICD-10-CM | POA: Diagnosis not present

## 2019-05-09 DIAGNOSIS — Z96651 Presence of right artificial knee joint: Secondary | ICD-10-CM | POA: Diagnosis not present

## 2019-05-09 DIAGNOSIS — Z87891 Personal history of nicotine dependence: Secondary | ICD-10-CM | POA: Diagnosis not present

## 2019-05-09 DIAGNOSIS — Z471 Aftercare following joint replacement surgery: Secondary | ICD-10-CM | POA: Diagnosis not present

## 2019-05-09 DIAGNOSIS — Z8546 Personal history of malignant neoplasm of prostate: Secondary | ICD-10-CM | POA: Diagnosis not present

## 2019-05-09 DIAGNOSIS — S098XXS Other specified injuries of head, sequela: Secondary | ICD-10-CM | POA: Diagnosis not present

## 2019-05-09 DIAGNOSIS — I10 Essential (primary) hypertension: Secondary | ICD-10-CM | POA: Diagnosis not present

## 2019-05-10 ENCOUNTER — Encounter: Payer: Self-pay | Admitting: Orthopaedic Surgery

## 2019-05-10 ENCOUNTER — Ambulatory Visit: Payer: PPO | Admitting: Orthopaedic Surgery

## 2019-05-10 ENCOUNTER — Telehealth: Payer: Self-pay | Admitting: Orthopaedic Surgery

## 2019-05-10 ENCOUNTER — Other Ambulatory Visit: Payer: Self-pay

## 2019-05-10 VITALS — Ht 70.0 in | Wt 188.0 lb

## 2019-05-10 DIAGNOSIS — M1711 Unilateral primary osteoarthritis, right knee: Secondary | ICD-10-CM

## 2019-05-10 DIAGNOSIS — Z96651 Presence of right artificial knee joint: Secondary | ICD-10-CM

## 2019-05-10 NOTE — Telephone Encounter (Signed)
FYI
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
## 2019-05-10 NOTE — Telephone Encounter (Signed)
thanks

## 2019-05-10 NOTE — Telephone Encounter (Signed)
Darin Williamson with [facility] called. She would like to let Dr. Tanda Rockers know that the OT Eval was completed. No further treatment needed.

## 2019-05-15 ENCOUNTER — Other Ambulatory Visit: Payer: Self-pay

## 2019-05-15 ENCOUNTER — Other Ambulatory Visit (HOSPITAL_COMMUNITY): Payer: Self-pay | Admitting: Internal Medicine

## 2019-05-15 ENCOUNTER — Ambulatory Visit (HOSPITAL_COMMUNITY)
Admission: RE | Admit: 2019-05-15 | Discharge: 2019-05-15 | Disposition: A | Discharge status: Home / Self Care | Payer: PPO | Source: Ambulatory Visit | Attending: Internal Medicine | Admitting: Internal Medicine

## 2019-05-15 ENCOUNTER — Other Ambulatory Visit: Payer: Self-pay | Admitting: Internal Medicine

## 2019-05-15 DIAGNOSIS — M79604 Pain in right leg: Secondary | ICD-10-CM

## 2019-05-15 DIAGNOSIS — E063 Autoimmune thyroiditis: Secondary | ICD-10-CM | POA: Diagnosis not present

## 2019-05-15 DIAGNOSIS — M79661 Pain in right lower leg: Secondary | ICD-10-CM | POA: Diagnosis not present

## 2019-05-15 DIAGNOSIS — Z6828 Body mass index (BMI) 28.0-28.9, adult: Secondary | ICD-10-CM | POA: Diagnosis not present

## 2019-05-15 DIAGNOSIS — I10 Essential (primary) hypertension: Secondary | ICD-10-CM | POA: Diagnosis not present

## 2019-05-15 DIAGNOSIS — M1991 Primary osteoarthritis, unspecified site: Secondary | ICD-10-CM | POA: Diagnosis not present

## 2019-05-15 DIAGNOSIS — L03115 Cellulitis of right lower limb: Secondary | ICD-10-CM | POA: Diagnosis not present

## 2019-05-16 ENCOUNTER — Ambulatory Visit (INDEPENDENT_AMBULATORY_CARE_PROVIDER_SITE_OTHER): Payer: PPO | Admitting: Physical Therapy

## 2019-05-16 ENCOUNTER — Encounter: Payer: Self-pay | Admitting: Physical Therapy

## 2019-05-16 DIAGNOSIS — M25561 Pain in right knee: Secondary | ICD-10-CM

## 2019-05-16 DIAGNOSIS — R262 Difficulty in walking, not elsewhere classified: Secondary | ICD-10-CM

## 2019-05-16 DIAGNOSIS — R600 Localized edema: Secondary | ICD-10-CM

## 2019-05-16 DIAGNOSIS — M25661 Stiffness of right knee, not elsewhere classified: Secondary | ICD-10-CM | POA: Diagnosis not present
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
## 2019-05-16 NOTE — Patient Instructions (Signed)
Access Code: [email or web address] URL: [email or web address] Date: 05/16/2019 Prepared by: Kearney Hard  Exercises Supine Bridge - 3 x daily - 7 x weekly - 3 sets - 10 reps - 5 seconds hold Long Sitting Quad Set with Towel Roll Under Heel - 3 x daily - 7 x weekly - 3 sets - 10 reps - 10 seconds hold Supine Active Straight Leg Raise - 3 x daily - 7 x weekly - 3 sets - 10 reps Seated Knee Flexion Extension AROM - 3 x daily - 7 x weekly - 3 sets - 10 reps Sit to Stand with Counter Support - 3 x daily - 7 x weekly - 3 sets - 10 reps

## 2019-05-19 ENCOUNTER — Other Ambulatory Visit: Payer: Self-pay

## 2019-05-19 ENCOUNTER — Encounter: Payer: Self-pay | Admitting: Physical Therapy

## 2019-05-19 ENCOUNTER — Ambulatory Visit: Payer: PPO | Admitting: Physical Therapy

## 2019-05-19 DIAGNOSIS — M25661 Stiffness of right knee, not elsewhere classified: Secondary | ICD-10-CM

## 2019-05-19 DIAGNOSIS — R262 Difficulty in walking, not elsewhere classified: Secondary | ICD-10-CM

## 2019-05-19 DIAGNOSIS — R600 Localized edema: Secondary | ICD-10-CM

## 2019-05-19 DIAGNOSIS — M25561 Pain in right knee: Secondary | ICD-10-CM

## 2019-05-19 NOTE — Patient Instructions (Signed)
Access Code: [email or web address] URL: [email or web address] Date: 05/19/2019 Prepared by: Hilda Blades  Exercises Supine Bridge - 3 x daily - 7 x weekly - 2 sets - 10 reps - 5 seconds hold Long Sitting Quad Set with Towel Roll Under Heel - 3 x daily - 7 x weekly - 10 reps - 10 seconds hold Supine Active Straight Leg Raise - 3 x daily - 7 x weekly - 2 sets - 20 reps Supine Heel Slide with Strap - 3 x daily - 7 x weekly - 10 reps - 10 seconds hold Seated Hamstring Stretch - 3 x daily - 7 x weekly - 3 reps - 15 seconds hold Seated Knee Flexion AAROM - 3 x daily - 7 x weekly - 10 reps - 10 seconds hold Sit to Stand without Arm Support - 3 x daily - 7 x weekly - 20 reps Standing Gastroc Stretch at Counter - 3 x daily - 7 x weekly - 3 reps - 20 seconds hold
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
## 2019-05-24 ENCOUNTER — Other Ambulatory Visit: Payer: Self-pay

## 2019-05-24 ENCOUNTER — Encounter: Payer: Self-pay | Admitting: Rehabilitative and Restorative Service Providers"

## 2019-05-24 ENCOUNTER — Ambulatory Visit: Payer: PPO | Admitting: Rehabilitative and Restorative Service Providers"

## 2019-05-24 ENCOUNTER — Telehealth: Payer: Self-pay | Admitting: Orthopaedic Surgery

## 2019-05-24 DIAGNOSIS — R262 Difficulty in walking, not elsewhere classified: Secondary | ICD-10-CM

## 2019-05-24 DIAGNOSIS — M25561 Pain in right knee: Secondary | ICD-10-CM

## 2019-05-24 DIAGNOSIS — R600 Localized edema: Secondary | ICD-10-CM

## 2019-05-24 DIAGNOSIS — M25661 Stiffness of right knee, not elsewhere classified: Secondary | ICD-10-CM | POA: Diagnosis not present

## 2019-05-24 MED ORDER — METHOCARBAMOL 500 MG PO TABS: 500.0000 mg | ORAL_TABLET | Freq: Three times a day (TID) | ORAL | 0 refills | Status: DC | PRN
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
## 2019-05-24 NOTE — Telephone Encounter (Signed)
Please advise 

## 2019-05-24 NOTE — Telephone Encounter (Signed)
Ok to refill robaxin

## 2019-05-24 NOTE — Telephone Encounter (Signed)
Patient is here for PT. Would like a refill on his robaxin. His call back number is [phone]

## 2019-05-24 NOTE — Telephone Encounter (Signed)
Refilled and sent to pharmacy. Tried to call patient at number below but "call couldn't be completed as dialed".

## 2019-05-26 ENCOUNTER — Other Ambulatory Visit: Payer: Self-pay

## 2019-05-26 ENCOUNTER — Encounter: Payer: Self-pay | Admitting: Physical Therapy

## 2019-05-26 ENCOUNTER — Ambulatory Visit (INDEPENDENT_AMBULATORY_CARE_PROVIDER_SITE_OTHER): Payer: PPO | Admitting: Physical Therapy

## 2019-05-26 DIAGNOSIS — R600 Localized edema: Secondary | ICD-10-CM | POA: Diagnosis not present

## 2019-05-26 DIAGNOSIS — M25661 Stiffness of right knee, not elsewhere classified: Secondary | ICD-10-CM

## 2019-05-26 DIAGNOSIS — R262 Difficulty in walking, not elsewhere classified: Secondary | ICD-10-CM

## 2019-05-26 DIAGNOSIS — M25561 Pain in right knee: Secondary | ICD-10-CM
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
## 2019-05-29 ENCOUNTER — Ambulatory Visit (INDEPENDENT_AMBULATORY_CARE_PROVIDER_SITE_OTHER): Payer: PPO | Admitting: Physical Therapy

## 2019-05-29 ENCOUNTER — Other Ambulatory Visit: Payer: Self-pay

## 2019-05-29 DIAGNOSIS — M25561 Pain in right knee: Secondary | ICD-10-CM | POA: Diagnosis not present

## 2019-05-29 DIAGNOSIS — R600 Localized edema: Secondary | ICD-10-CM | POA: Diagnosis not present

## 2019-05-29 DIAGNOSIS — R262 Difficulty in walking, not elsewhere classified: Secondary | ICD-10-CM | POA: Diagnosis not present

## 2019-05-29 DIAGNOSIS — M25661 Stiffness of right knee, not elsewhere classified: Secondary | ICD-10-CM
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
## 2019-05-30 ENCOUNTER — Encounter: Payer: PPO | Admitting: Rehabilitative and Restorative Service Providers"

## 2019-06-01 ENCOUNTER — Encounter: Payer: PPO | Admitting: Physical Therapy

## 2019-06-06 ENCOUNTER — Other Ambulatory Visit: Payer: Self-pay

## 2019-06-06 ENCOUNTER — Ambulatory Visit: Payer: PPO | Admitting: Physical Therapy

## 2019-06-06 ENCOUNTER — Telehealth: Payer: Self-pay | Admitting: Orthopaedic Surgery

## 2019-06-06 ENCOUNTER — Encounter: Payer: Self-pay | Admitting: Physical Therapy

## 2019-06-06 DIAGNOSIS — M25661 Stiffness of right knee, not elsewhere classified: Secondary | ICD-10-CM

## 2019-06-06 DIAGNOSIS — R262 Difficulty in walking, not elsewhere classified: Secondary | ICD-10-CM

## 2019-06-06 DIAGNOSIS — R600 Localized edema: Secondary | ICD-10-CM

## 2019-06-06 DIAGNOSIS — M25561 Pain in right knee: Secondary | ICD-10-CM

## 2019-06-06 NOTE — Telephone Encounter (Signed)
Number given by patient is for [facility], and the number in chart "cannot be completed as dialed". I have tried to relay message to patient, but neither number works.

## 2019-06-06 NOTE — Telephone Encounter (Signed)
Patient is here for PT. Would like to know if he is able to drive? His number is [phone]

## 2019-06-06 NOTE — Telephone Encounter (Signed)
Have PT assess his status for driving or will determine at next f/u-should be this week
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
## 2019-06-06 NOTE — Telephone Encounter (Signed)
Right TKA 04/11/2019 Please advise

## 2019-06-08 ENCOUNTER — Encounter: Payer: PPO | Admitting: Physical Therapy

## 2019-06-09 ENCOUNTER — Telehealth: Payer: Self-pay | Admitting: Orthopaedic Surgery

## 2019-06-09 ENCOUNTER — Ambulatory Visit: Payer: PPO | Admitting: Physical Therapy

## 2019-06-09 ENCOUNTER — Other Ambulatory Visit: Payer: Self-pay

## 2019-06-09 DIAGNOSIS — M25661 Stiffness of right knee, not elsewhere classified: Secondary | ICD-10-CM | POA: Diagnosis not present

## 2019-06-09 DIAGNOSIS — R262 Difficulty in walking, not elsewhere classified: Secondary | ICD-10-CM

## 2019-06-09 DIAGNOSIS — R600 Localized edema: Secondary | ICD-10-CM | POA: Diagnosis not present

## 2019-06-09 DIAGNOSIS — M25561 Pain in right knee: Secondary | ICD-10-CM | POA: Diagnosis not present

## 2019-06-09 NOTE — Telephone Encounter (Signed)
Please advise 
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
## 2019-06-09 NOTE — Telephone Encounter (Signed)
Patient is here for PT. He would like to know if he can get a handicap placard. His call back number was updated. Phone number is [phone]

## 2019-06-12 NOTE — Telephone Encounter (Signed)
See below. Sending to you to have Aaron Edelman complete tomorrow.

## 2019-06-12 NOTE — Telephone Encounter (Signed)
Have Darin Williamson complete tomorrow

## 2019-06-13 ENCOUNTER — Ambulatory Visit: Payer: PPO | Admitting: Physical Therapy

## 2019-06-13 ENCOUNTER — Encounter: Payer: Self-pay | Admitting: Physical Therapy

## 2019-06-13 ENCOUNTER — Other Ambulatory Visit: Payer: Self-pay

## 2019-06-13 DIAGNOSIS — M25561 Pain in right knee: Secondary | ICD-10-CM | POA: Diagnosis not present

## 2019-06-13 DIAGNOSIS — M25661 Stiffness of right knee, not elsewhere classified: Secondary | ICD-10-CM

## 2019-06-13 DIAGNOSIS — R600 Localized edema: Secondary | ICD-10-CM | POA: Diagnosis not present

## 2019-06-13 DIAGNOSIS — R262 Difficulty in walking, not elsewhere classified: Secondary | ICD-10-CM

## 2019-06-13 NOTE — Telephone Encounter (Signed)
Tried to call patient. No answer. Left message that I would leave handicap application up front at check in.
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
## 2019-06-16 ENCOUNTER — Other Ambulatory Visit: Payer: Self-pay

## 2019-06-16 ENCOUNTER — Encounter: Payer: Self-pay | Admitting: Rehabilitative and Restorative Service Providers"

## 2019-06-16 ENCOUNTER — Ambulatory Visit (INDEPENDENT_AMBULATORY_CARE_PROVIDER_SITE_OTHER): Payer: PPO | Admitting: Rehabilitative and Restorative Service Providers"

## 2019-06-16 DIAGNOSIS — R600 Localized edema: Secondary | ICD-10-CM | POA: Diagnosis not present

## 2019-06-16 DIAGNOSIS — M25561 Pain in right knee: Secondary | ICD-10-CM | POA: Diagnosis not present

## 2019-06-16 DIAGNOSIS — M25661 Stiffness of right knee, not elsewhere classified: Secondary | ICD-10-CM

## 2019-06-16 DIAGNOSIS — R262 Difficulty in walking, not elsewhere classified: Secondary | ICD-10-CM | POA: Diagnosis not present
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
## 2019-06-20 ENCOUNTER — Other Ambulatory Visit: Payer: Self-pay

## 2019-06-20 ENCOUNTER — Encounter: Payer: Self-pay | Admitting: Orthopaedic Surgery

## 2019-06-20 ENCOUNTER — Ambulatory Visit: Payer: PPO | Admitting: Orthopaedic Surgery

## 2019-06-20 VITALS — Ht 70.0 in | Wt 188.0 lb

## 2019-06-20 DIAGNOSIS — Z96651 Presence of right artificial knee joint: Secondary | ICD-10-CM

## 2019-06-20 MED ORDER — METHOCARBAMOL 500 MG PO TABS
ORAL_TABLET | ORAL | 0 refills | Status: DC
Start: 2019-06-20 — End: 2020-08-01
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
## 2019-07-11 DIAGNOSIS — I10 Essential (primary) hypertension: Secondary | ICD-10-CM | POA: Diagnosis not present

## 2019-07-11 DIAGNOSIS — Z6828 Body mass index (BMI) 28.0-28.9, adult: Secondary | ICD-10-CM | POA: Diagnosis not present

## 2019-07-11 DIAGNOSIS — M1991 Primary osteoarthritis, unspecified site: Secondary | ICD-10-CM | POA: Diagnosis not present

## 2019-07-11 DIAGNOSIS — E663 Overweight: Secondary | ICD-10-CM | POA: Diagnosis not present

## 2019-07-11 DIAGNOSIS — E063 Autoimmune thyroiditis: Secondary | ICD-10-CM | POA: Diagnosis not present

## 2019-09-11 ENCOUNTER — Other Ambulatory Visit: Payer: Self-pay

## 2019-09-11 ENCOUNTER — Other Ambulatory Visit: Payer: Self-pay | Admitting: Internal Medicine

## 2019-09-11 ENCOUNTER — Ambulatory Visit (HOSPITAL_COMMUNITY)
Admission: RE | Admit: 2019-09-11 | Discharge: 2019-09-11 | Disposition: A | Discharge status: Home / Self Care | Payer: PPO | Source: Ambulatory Visit | Attending: Internal Medicine | Admitting: Internal Medicine

## 2019-09-11 DIAGNOSIS — E063 Autoimmune thyroiditis: Secondary | ICD-10-CM | POA: Diagnosis not present

## 2019-09-11 DIAGNOSIS — I639 Cerebral infarction, unspecified: Secondary | ICD-10-CM

## 2019-09-11 DIAGNOSIS — J341 Cyst and mucocele of nose and nasal sinus: Secondary | ICD-10-CM | POA: Diagnosis not present

## 2019-09-11 DIAGNOSIS — I10 Essential (primary) hypertension: Secondary | ICD-10-CM | POA: Diagnosis not present

## 2019-09-11 DIAGNOSIS — R531 Weakness: Secondary | ICD-10-CM | POA: Diagnosis not present

## 2019-09-11 DIAGNOSIS — Z6829 Body mass index (BMI) 29.0-29.9, adult: Secondary | ICD-10-CM | POA: Diagnosis not present

## 2019-09-11 DIAGNOSIS — M1991 Primary osteoarthritis, unspecified site: Secondary | ICD-10-CM | POA: Diagnosis not present

## 2019-09-15 ENCOUNTER — Ambulatory Visit: Payer: PPO | Admitting: Neurology

## 2019-09-15 ENCOUNTER — Encounter: Payer: Self-pay | Admitting: Neurology

## 2019-09-15 ENCOUNTER — Other Ambulatory Visit: Payer: Self-pay

## 2019-09-15 VITALS — BP 182/81 | HR 79 | Resp 18 | Ht 69.0 in | Wt 191.0 lb

## 2019-09-15 DIAGNOSIS — M4712 Other spondylosis with myelopathy, cervical region: Secondary | ICD-10-CM | POA: Diagnosis not present

## 2019-09-15 DIAGNOSIS — G8321 Monoplegia of upper limb affecting right dominant side: Secondary | ICD-10-CM

## 2019-09-15 DIAGNOSIS — G959 Disease of spinal cord, unspecified: Secondary | ICD-10-CM | POA: Diagnosis not present
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
## 2019-09-15 NOTE — Patient Instructions (Addendum)
Nerve testing of the right arm  MRI cervical spine without contrast  ELECTROMYOGRAM AND NERVE CONDUCTION STUDIES (EMG/NCS) INSTRUCTIONS  How to Prepare The neurologist conducting the EMG will need to know if you have certain medical conditions. Tell the neurologist and other EMG lab personnel if you: . Have a pacemaker or any other electrical medical device . Take blood-thinning medications . Have hemophilia, a blood-clotting disorder that causes prolonged bleeding Bathing Take a shower or bath shortly before your exam in order to remove oils from your skin. Don't apply lotions or creams before the exam.  What to Expect You'll likely be asked to change into a hospital gown for the procedure and lie down on an examination table. The following explanations can help you understand what will happen during the exam.  . Electrodes. The neurologist or a technician places surface electrodes at various locations on your skin depending on where you're experiencing symptoms. Or the neurologist may insert needle electrodes at different sites depending on your symptoms.  . Sensations. The electrodes will at times transmit a tiny electrical current that you may feel as a twinge or spasm. The needle electrode may cause discomfort or pain that usually ends shortly after the needle is removed. If you are concerned about discomfort or pain, you may want to talk to the neurologist about taking a short break during the exam.  . Instructions. During the needle EMG, the neurologist will assess whether there is any spontaneous electrical activity when the muscle is at rest - activity that isn't present in healthy muscle tissue - and the degree of activity when you slightly contract the muscle.  He or she will give you instructions on resting and contracting a muscle at appropriate times. Depending on what muscles and nerves the neurologist is examining, he or she may ask you to change positions during the exam.  After  your EMG You may experience some temporary, minor bruising where the needle electrode was inserted into your muscle. This bruising should fade within several days. If it persists, contact your primary care doctor.   We have sent a referral to [facility] for your MRI and they will call you directly to schedule your appointment. They are located at [place]. If you need to contact them directly please call [phone].

## 2019-09-24 ENCOUNTER — Ambulatory Visit
Admission: RE | Admit: 2019-09-24 | Discharge: 2019-09-24 | Disposition: A | Discharge status: Home / Self Care | Payer: PPO | Source: Ambulatory Visit | Attending: Neurology | Admitting: Neurology

## 2019-09-24 DIAGNOSIS — G959 Disease of spinal cord, unspecified: Secondary | ICD-10-CM

## 2019-09-24 DIAGNOSIS — M4802 Spinal stenosis, cervical region: Secondary | ICD-10-CM | POA: Diagnosis not present

## 2019-09-25 ENCOUNTER — Telehealth: Payer: Self-pay | Admitting: Neurology

## 2019-09-25 NOTE — Telephone Encounter (Signed)
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 There is questionable cord signal change at C6 level.  To better assess the degree of cervical radiculopathy causing his proximal arm weakness, he will return for EMG.  I will also refer him to PT arm strengthening and [facility] for their opinion on right C5-6 foraminal stenosis.

## 2019-09-25 NOTE — Telephone Encounter (Signed)
I called patient I do not see results avaliable

## 2019-09-25 NOTE — Telephone Encounter (Signed)
Please see results, thanks

## 2019-09-25 NOTE — Telephone Encounter (Signed)
Patient called and left a message for recent MRI results from 09/24/19.

## 2019-09-25 NOTE — Telephone Encounter (Signed)
Patient called to check on his results.

## 2019-09-26 ENCOUNTER — Encounter: Payer: Self-pay | Admitting: Orthopaedic Surgery

## 2019-09-26 ENCOUNTER — Other Ambulatory Visit: Payer: Self-pay

## 2019-09-26 ENCOUNTER — Ambulatory Visit: Payer: PPO | Admitting: Orthopaedic Surgery

## 2019-09-26 VITALS — Ht 69.0 in | Wt 191.0 lb

## 2019-09-26 DIAGNOSIS — G40909 Epilepsy, unspecified, not intractable, without status epilepticus: Secondary | ICD-10-CM | POA: Diagnosis not present

## 2019-09-26 DIAGNOSIS — R29898 Other symptoms and signs involving the musculoskeletal system: Secondary | ICD-10-CM

## 2019-09-26 DIAGNOSIS — Z96651 Presence of right artificial knee joint: Secondary | ICD-10-CM

## 2019-09-26 DIAGNOSIS — M25511 Pain in right shoulder: Secondary | ICD-10-CM | POA: Diagnosis not present
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
## 2019-09-27 ENCOUNTER — Other Ambulatory Visit: Payer: Self-pay

## 2019-09-27 DIAGNOSIS — G959 Disease of spinal cord, unspecified: Secondary | ICD-10-CM

## 2019-09-27 NOTE — Telephone Encounter (Signed)
Referral placed and orders sent.

## 2019-10-09 DIAGNOSIS — M6281 Muscle weakness (generalized): Secondary | ICD-10-CM | POA: Diagnosis not present

## 2019-10-09 DIAGNOSIS — M25511 Pain in right shoulder: Secondary | ICD-10-CM | POA: Diagnosis not present

## 2019-10-09 DIAGNOSIS — M25611 Stiffness of right shoulder, not elsewhere classified: Secondary | ICD-10-CM | POA: Diagnosis not present

## 2019-10-09 DIAGNOSIS — M545 Low back pain: Secondary | ICD-10-CM | POA: Diagnosis not present

## 2019-10-16 DIAGNOSIS — M6281 Muscle weakness (generalized): Secondary | ICD-10-CM | POA: Diagnosis not present

## 2019-10-16 DIAGNOSIS — M25511 Pain in right shoulder: Secondary | ICD-10-CM | POA: Diagnosis not present

## 2019-10-16 DIAGNOSIS — M545 Low back pain: Secondary | ICD-10-CM | POA: Diagnosis not present

## 2019-10-16 DIAGNOSIS — M25611 Stiffness of right shoulder, not elsewhere classified: Secondary | ICD-10-CM | POA: Diagnosis not present

## 2019-10-17 ENCOUNTER — Ambulatory Visit (INDEPENDENT_AMBULATORY_CARE_PROVIDER_SITE_OTHER): Payer: PPO | Admitting: Neurology

## 2019-10-17 ENCOUNTER — Other Ambulatory Visit: Payer: Self-pay

## 2019-10-17 DIAGNOSIS — G8321 Monoplegia of upper limb affecting right dominant side: Secondary | ICD-10-CM | POA: Diagnosis not present

## 2019-10-17 DIAGNOSIS — G959 Disease of spinal cord, unspecified: Secondary | ICD-10-CM

## 2019-10-17 DIAGNOSIS — M4712 Other spondylosis with myelopathy, cervical region: Secondary | ICD-10-CM

## 2019-10-17 NOTE — Procedures (Signed)
[facility]  [address], [address]  [place], [place] [address] Tel: [phone] Fax:  [phone] Test Date:  10/17/2019  Patient: Darin Williamson DOB: 10-06-1945 Physician: Narda Amber, DO  Sex: Male Height: 5\' 9"  Ref Phys: Narda Amber, DO  ID#: [id number] Temp: 34.0C Technician:    Patient Complaints: This is a 74 year old man referred for evaluation of left proximal arm weakness.  NCV & EMG Findings: Extensive electrodiagnostic testing of the right upper extremity shows:  1. Right median sensory response is mildly prolonged (4.1 ms).  Right ulnar and radial sensory responses are within normal limits.   2. Right median motor response is mildly prolonged (4.1 ms).  Right ulnar motor responses within normal limits.   3. Chronic motor axonal loss changes are seen affecting the C7-C8 myotome on the right, without accompanying active denervation.    Impression: 1. 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 2. Right median neuropathy at or distal to the wrist, consistent with a clinical diagnosis of carpal tunnel syndrome.  Overall, these findings are moderate in degree electrically. 3. There is no no evidence of a right brachial plexopathy.   ___________________________ Narda Amber, DO    Nerve Conduction Studies Anti Sensory Summary Table   Stim Site NR Peak (ms) Norm Peak (ms) P-T Amp (V) Norm P-T Amp  Right Median Anti Sensory (2nd Digit)  34C  Wrist    4.1 <3.8 15.4 >10  Right Radial Anti Sensory (Base 1st Digit)  34C  Wrist    2.0 <2.8 18.9 >10  Right Ulnar Anti Sensory (5th Digit)  34C  Wrist    2.8 <3.2 13.9 >5   Motor Summary Table   Stim Site NR Onset (ms) Norm Onset (ms) O-P Amp (mV) Norm O-P Amp Site1 Site2 Delta-0 (ms) Dist (cm) Vel (m/s) Norm Vel (m/s)  Right Median Motor (Abd Poll Brev)  34C  Wrist    4.1 <4.0 6.7 >5 Elbow Wrist 5.2 28.0 54 >50  Elbow    9.3  6.7         Right Ulnar Motor (Abd Dig  Minimi)  34C  Wrist    2.6 <3.1 8.0 >7 B Elbow Wrist 4.3 23.0 53 >50  B Elbow    6.9  7.3  A Elbow B Elbow 1.9 10.0 53 >50  A Elbow    8.8  6.9          EMG   Side Muscle Ins Act Fibs Psw Fasc Number Recrt Dur Dur. Amp Amp. Poly Poly. Comment  Right 1stDorInt Nml Nml Nml Nml 1- Rapid Some 1+ Some 1+ Nml Nml N/A  Right Abd Poll Brev Nml Nml Nml Nml Nml Nml Nml Nml Nml Nml Nml Nml N/A  Right PronatorTeres Nml Nml Nml Nml 1- Rapid Some 1+ Some 1+ Nml Nml N/A  Right Biceps Nml Nml Nml Nml Nml Nml Nml Nml Nml Nml Nml Nml N/A  Right Triceps Nml Nml Nml Nml 1- Rapid Some 1+ Some 1+ Nml Nml N/A  Right Deltoid Nml Nml Nml Nml Nml Nml Nml Nml Nml Nml Nml Nml N/A  Right Infraspinatus Nml Nml Nml Nml Nml Nml Nml Nml Nml Nml Nml Nml N/A  Right Ext Indicis Nml Nml Nml Nml 1- Rapid Some 1+ Some 1+ Nml Nml N/A      Waveforms:

## 2019-10-17 NOTE — Progress Notes (Signed)
Follow-up Visit   Date: 10/17/19   Darin Williamson MRN: [id number] DOB: [birth year]   Interim History: Darin Williamson is a 74 y.o. right-handed Caucasian male with hypertension and seizure disorder returning to the clinic for follow-up of right arm weakness.  The patient was accompanied to the clinic by self.  History of present illness:  On 8/11 he woke up with right arm weakness, specifically, he was unable to raise his right arm, but still able to bend at the elbow and move the fingers.  He did not have weakness in the right face or legs. There was no associated shoulder or neck pain.  He has an area of numbness over the right shoulder blade.  Symptoms have been constant and not progressed since onset.  He denies similar weakness in the left arm.  No preceding injury or illness. He saw his PCP who ordered MRI brain.  Imaging shows no acute stroke, bleed, or mass. He retired from working [employer].    UPDATE 10/17/2019:  He is here for EDX and follow-up.  Over the past two weeks, his right arm strength has markedly improved and is now back to normal.  He is able to raise the arm without difficulty. MRI cervical spine showed multilevel degenerative changes, with canal stenosis at C3-4 and multilevel foraminal stenosis.    Medications:  Current Outpatient Medications on File Prior to Visit  Medication Sig Dispense Refill  . acetaminophen (TYLENOL) 650 MG CR tablet Take 650 mg by mouth every 6 (six) hours.    Marland Kitchen amLODipine (NORVASC) 10 MG tablet TAKE (1) TABLET BY MOUTHGONCE DAILY.    Marland Kitchen amLODipine (NORVASC) 5 MG tablet Take 1 tablet (5 mg total) by mouth daily. (Patient taking differently: Take 10 mg by mouth daily. ) 30 tablet 0  . aspirin 81 MG chewable tablet Chew 1 tablet (81 mg total) by mouth 2 (two) times daily. 60 tablet 1  . methocarbamol (ROBAXIN) 500 MG tablet Take 1 tablet (500 mg total) by mouth every 8 (eight) hours as needed for muscle spasms. 30 tablet 0  . methocarbamol  (ROBAXIN) 500 MG tablet Take 1 tablet every 8 hours as needed for muscle spasms. 30 tablet 0  . NON FORMULARY Diet: __x___ Regular,  ______ NAS,  _______Consistent Carbohydrate,  _______NPO  _____Other    . phenytoin (DILANTIN) 100 MG ER capsule TAKE TWO CAPSULES IN THE MORNING AND 1 IN THE EVENING. 90 capsule 0   No current facility-administered medications on file prior to visit.    Allergies:  Allergies  Allergen Reactions  . Prednisone Hives    Neurological Exam: MENTAL STATUS including orientation to time, place, person, recent and remote memory, attention span and concentration, language, and fund of knowledge is normal.  Speech is not dysarthric.  CRANIAL NERVES:   Pupils equal round and reactive to light.  Normal conjugate, extra-ocular eye movements in all directions of gaze.  MOTOR:  No atrophy, fasciculations or abnormal movements.  No pronator drift.  * improved Upper Extremity:  Right  Left  Deltoid * 5/5   5/5   Biceps * 5/5   5/5   Triceps  5/5   5/5   Infraspinatus 5/5  5/5  Medial pectoralis 5/5  5/5  Wrist extensors  5/5   5/5   Wrist flexors  5/5   5/5   Finger extensors  5/5   5/5   Finger flexors  5/5   5/5   Dorsal interossei  5/5  5/5   Abductor pollicis  5/5   5/5   Tone (Ashworth scale)  0  0   Lower Extremity:  Right  Left  Hip flexors  5/5   5/5   Hip extensors  5/5   5/5   Adductor 5/5  5/5  Abductor 5/5  5/5  Knee flexors  5/5   5/5   Knee extensors  5/5   5/5   Dorsiflexors  5/5   5/5   Plantarflexors  5/5   5/5   Toe extensors  5/5   5/5   Toe flexors  5/5   5/5   Tone (Ashworth scale)  0  0    MSRs:  Reflexes are 2+/4 throughout, except 3+ bilateral patella.  COORDINATION/GAIT:    Gait narrow based and stable.   Data: MRI cervical spine wo contrast 09/24/2019: Multilevel degenerative changes with suboptimal stenosis evaluation due to motion artifact. Canal stenosis is greatest at C3-C4. There is multilevel significant right  facet degeneration. Multilevel marked right foraminal stenosis.  Suspected abnormal cord signal at the C6 level reflecting myelomalacia or edema. However, there is no significant canal narrowing at this level.  MRI brain wo contrast 09/11/2019: No evidence of recent infarction, hemorrhage, or mass. Probable mild to moderate chronic microvascular ischemic changes.  NCS/EMG of the right arm 10/17/2019: 1. Chronic C7-8 radiculopathy affecting the right upper extremity 2. Right median neuropathy at or distal to the wrist, consistent with a clinical diagnosis of carpal tunnel syndrome.  Overall, these findings are moderate in degree electrically. 3. There is no evidence of a right brachial plexopathy   IMPRESSION/PLAN: Right arm weakness, resolved. MRI with multilevel degenerative changes, spinal canal stenosis at C3-4, and multilevel foraminal stenosis.  EMG shows C7-8 radiculopathy, no clear C5 involvement or brachial plexopathy.  I suspect there was some spinal canal impingement which has quickly improved.  - Continue PT  Right carpal tunnel syndrome, mild and asymptomatic.   Return to clinic as needed  Thank you for allowing me to participate in patient's care.  If I can answer any additional questions, I would be pleased to do so.    Sincerely,    Egidio Lofgren K. Posey Pronto, DO

## 2019-10-19 ENCOUNTER — Other Ambulatory Visit: Payer: PPO

## 2019-10-23 DIAGNOSIS — M25511 Pain in right shoulder: Secondary | ICD-10-CM | POA: Diagnosis not present

## 2019-10-23 DIAGNOSIS — M6281 Muscle weakness (generalized): Secondary | ICD-10-CM | POA: Diagnosis not present

## 2019-10-23 DIAGNOSIS — M545 Low back pain: Secondary | ICD-10-CM | POA: Diagnosis not present

## 2019-10-23 DIAGNOSIS — M25611 Stiffness of right shoulder, not elsewhere classified: Secondary | ICD-10-CM | POA: Diagnosis not present

## 2019-10-26 ENCOUNTER — Ambulatory Visit: Payer: PPO | Admitting: Orthopaedic Surgery

## 2019-10-26 DIAGNOSIS — H40013 Open angle with borderline findings, low risk, bilateral: Secondary | ICD-10-CM | POA: Diagnosis not present

## 2019-10-26 DIAGNOSIS — H25811 Combined forms of age-related cataract, right eye: Secondary | ICD-10-CM | POA: Diagnosis not present

## 2019-10-26 DIAGNOSIS — Z961 Presence of intraocular lens: Secondary | ICD-10-CM | POA: Diagnosis not present

## 2019-10-30 DIAGNOSIS — M25611 Stiffness of right shoulder, not elsewhere classified: Secondary | ICD-10-CM | POA: Diagnosis not present

## 2019-10-30 DIAGNOSIS — M25511 Pain in right shoulder: Secondary | ICD-10-CM | POA: Diagnosis not present

## 2019-10-30 DIAGNOSIS — M545 Low back pain, unspecified: Secondary | ICD-10-CM | POA: Diagnosis not present

## 2019-10-30 DIAGNOSIS — M6281 Muscle weakness (generalized): Secondary | ICD-10-CM | POA: Diagnosis not present

## 2019-11-06 DIAGNOSIS — M6281 Muscle weakness (generalized): Secondary | ICD-10-CM | POA: Diagnosis not present

## 2019-11-06 DIAGNOSIS — M25511 Pain in right shoulder: Secondary | ICD-10-CM | POA: Diagnosis not present

## 2019-11-06 DIAGNOSIS — M25611 Stiffness of right shoulder, not elsewhere classified: Secondary | ICD-10-CM | POA: Diagnosis not present

## 2019-11-06 DIAGNOSIS — M545 Low back pain, unspecified: Secondary | ICD-10-CM | POA: Diagnosis not present

## 2019-11-09 DIAGNOSIS — Z23 Encounter for immunization: Secondary | ICD-10-CM | POA: Diagnosis not present

## 2019-11-27 ENCOUNTER — Other Ambulatory Visit: Payer: Self-pay

## 2019-11-27 DIAGNOSIS — C61 Malignant neoplasm of prostate: Secondary | ICD-10-CM

## 2019-11-28 ENCOUNTER — Other Ambulatory Visit: Payer: Self-pay

## 2019-11-28 ENCOUNTER — Other Ambulatory Visit: Payer: PPO

## 2019-11-28 DIAGNOSIS — C61 Malignant neoplasm of prostate: Secondary | ICD-10-CM

## 2019-11-29 LAB — PSA: Prostate Specific Ag, Serum: <0.1 ng/mL (ref 0.0–4.0)

## 2019-12-05 ENCOUNTER — Other Ambulatory Visit: Payer: Self-pay

## 2019-12-05 ENCOUNTER — Encounter: Payer: Self-pay | Admitting: Urology

## 2019-12-05 ENCOUNTER — Ambulatory Visit (INDEPENDENT_AMBULATORY_CARE_PROVIDER_SITE_OTHER): Payer: PPO | Admitting: Urology

## 2019-12-05 VITALS — BP 162/80 | HR 59 | Temp 98.7°F | Ht 69.0 in | Wt 191.0 lb

## 2019-12-05 DIAGNOSIS — C61 Malignant neoplasm of prostate: Secondary | ICD-10-CM

## 2019-12-05 LAB — URINALYSIS, ROUTINE W REFLEX MICROSCOPIC
Bilirubin, UA: NEGATIVE
Glucose, UA: NEGATIVE
Leukocytes,UA: NEGATIVE
Nitrite, UA: NEGATIVE
Protein,UA: NEGATIVE
RBC, UA: NEGATIVE
Specific Gravity, UA: 1.02 (ref 1.005–1.030)
Urobilinogen, Ur: 0.2 mg/dL (ref 0.2–1.0)
pH, UA: 5 (ref 5.0–7.5)

## 2019-12-05 MED ORDER — OXYBUTYNIN CHLORIDE ER 5 MG PO TB24: 5.0000 mg | ORAL_TABLET | Freq: Two times a day (BID) | ORAL | 3 refills | Status: DC
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
## 2019-12-05 NOTE — Progress Notes (Signed)

## 2019-12-05 NOTE — Addendum Note (Signed)
Addended by: Valentina Lucks on: 12/05/2019 01:56 PM   Modules accepted: Orders

## 2019-12-07 DIAGNOSIS — M1991 Primary osteoarthritis, unspecified site: Secondary | ICD-10-CM | POA: Diagnosis not present

## 2019-12-07 DIAGNOSIS — C61 Malignant neoplasm of prostate: Secondary | ICD-10-CM | POA: Diagnosis not present

## 2019-12-07 DIAGNOSIS — I10 Essential (primary) hypertension: Secondary | ICD-10-CM | POA: Diagnosis not present

## 2019-12-07 DIAGNOSIS — Z6830 Body mass index (BMI) 30.0-30.9, adult: Secondary | ICD-10-CM | POA: Diagnosis not present

## 2020-02-07 DIAGNOSIS — M1991 Primary osteoarthritis, unspecified site: Secondary | ICD-10-CM | POA: Diagnosis not present

## 2020-02-07 DIAGNOSIS — Z1331 Encounter for screening for depression: Secondary | ICD-10-CM | POA: Diagnosis not present

## 2020-02-07 DIAGNOSIS — I10 Essential (primary) hypertension: Secondary | ICD-10-CM | POA: Diagnosis not present

## 2020-02-07 DIAGNOSIS — Z6829 Body mass index (BMI) 29.0-29.9, adult: Secondary | ICD-10-CM | POA: Diagnosis not present

## 2020-02-07 DIAGNOSIS — G40909 Epilepsy, unspecified, not intractable, without status epilepticus: Secondary | ICD-10-CM | POA: Diagnosis not present

## 2020-03-25 DIAGNOSIS — C61 Malignant neoplasm of prostate: Secondary | ICD-10-CM | POA: Diagnosis not present

## 2020-03-25 DIAGNOSIS — I10 Essential (primary) hypertension: Secondary | ICD-10-CM | POA: Diagnosis not present

## 2020-03-25 DIAGNOSIS — Z0001 Encounter for general adult medical examination with abnormal findings: Secondary | ICD-10-CM | POA: Diagnosis not present

## 2020-03-25 DIAGNOSIS — Z6829 Body mass index (BMI) 29.0-29.9, adult: Secondary | ICD-10-CM | POA: Diagnosis not present

## 2020-03-25 DIAGNOSIS — Z1331 Encounter for screening for depression: Secondary | ICD-10-CM | POA: Diagnosis not present

## 2020-03-25 DIAGNOSIS — M1991 Primary osteoarthritis, unspecified site: Secondary | ICD-10-CM | POA: Diagnosis not present

## 2020-03-25 DIAGNOSIS — Z1389 Encounter for screening for other disorder: Secondary | ICD-10-CM | POA: Diagnosis not present

## 2020-03-25 DIAGNOSIS — E781 Pure hyperglyceridemia: Secondary | ICD-10-CM | POA: Diagnosis not present

## 2020-03-25 DIAGNOSIS — E663 Overweight: Secondary | ICD-10-CM | POA: Diagnosis not present

## 2020-07-10 DIAGNOSIS — H6122 Impacted cerumen, left ear: Secondary | ICD-10-CM | POA: Diagnosis not present

## 2020-07-10 DIAGNOSIS — M1991 Primary osteoarthritis, unspecified site: Secondary | ICD-10-CM | POA: Diagnosis not present

## 2020-07-10 DIAGNOSIS — M67441 Ganglion, right hand: Secondary | ICD-10-CM | POA: Diagnosis not present

## 2020-07-10 DIAGNOSIS — I10 Essential (primary) hypertension: Secondary | ICD-10-CM | POA: Diagnosis not present

## 2020-07-10 DIAGNOSIS — E063 Autoimmune thyroiditis: Secondary | ICD-10-CM | POA: Diagnosis not present

## 2020-07-15 ENCOUNTER — Encounter: Payer: Self-pay | Admitting: Orthopaedic Surgery

## 2020-07-17 ENCOUNTER — Ambulatory Visit (INDEPENDENT_AMBULATORY_CARE_PROVIDER_SITE_OTHER): Payer: Medicare Other | Admitting: Orthopaedic Surgery

## 2020-07-17 ENCOUNTER — Other Ambulatory Visit: Payer: Self-pay

## 2020-07-17 ENCOUNTER — Encounter: Payer: Self-pay | Admitting: Orthopaedic Surgery

## 2020-07-17 VITALS — Ht 69.0 in | Wt 191.0 lb

## 2020-07-17 DIAGNOSIS — Z96651 Presence of right artificial knee joint: Secondary | ICD-10-CM | POA: Diagnosis not present

## 2020-07-17 DIAGNOSIS — M1711 Unilateral primary osteoarthritis, right knee: Secondary | ICD-10-CM | POA: Diagnosis not present

## 2020-07-17 MED ORDER — LIDOCAINE HCL 1 % IJ SOLN
2.0000 mL | INTRAMUSCULAR | Status: AC | PRN
  Administered 2020-07-17: 14:00:00 2 mL

## 2020-07-17 NOTE — Addendum Note (Signed)
Addended by: Lendon Collar on: 07/17/2020 01:42 PM   Modules accepted: Orders
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
## 2020-07-19 ENCOUNTER — Other Ambulatory Visit: Payer: Self-pay

## 2020-07-19 ENCOUNTER — Other Ambulatory Visit: Payer: Self-pay | Admitting: Orthopedic Surgery

## 2020-07-19 ENCOUNTER — Ambulatory Visit: Payer: Medicare Other

## 2020-07-19 ENCOUNTER — Ambulatory Visit (INDEPENDENT_AMBULATORY_CARE_PROVIDER_SITE_OTHER): Payer: Medicare Other | Admitting: Orthopedic Surgery

## 2020-07-19 ENCOUNTER — Encounter: Payer: Self-pay | Admitting: Orthopedic Surgery

## 2020-07-19 VITALS — BP 140/84 | HR 50 | Ht 69.0 in | Wt 190.0 lb

## 2020-07-19 DIAGNOSIS — R229 Localized swelling, mass and lump, unspecified: Secondary | ICD-10-CM

## 2020-07-19 DIAGNOSIS — M67432 Ganglion, left wrist: Secondary | ICD-10-CM | POA: Diagnosis not present

## 2020-07-19 DIAGNOSIS — M6780 Other specified disorders of synovium and tendon, unspecified site: Secondary | ICD-10-CM | POA: Diagnosis not present
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
## 2020-07-24 LAB — ANAEROBIC AND AEROBIC CULTURE
AER RESULT:: NO GROWTH
MICRO NUMBER:: 12037322
MICRO NUMBER:: 12037323
SPECIMEN QUALITY:: ADEQUATE
SPECIMEN QUALITY:: ADEQUATE

## 2020-07-24 LAB — SYNOVIAL FLUID ANALYSIS, COMPLETE
Basophils, %: 0 %
Eosinophils-Synovial: 0 % (ref 0–2)
Lymphocytes-Synovial Fld: 63 % (ref 0–74)
Monocyte/Macrophage: 25 % (ref 0–69)
Neutrophil, Synovial: 11 % (ref 0–24)
Synoviocytes, %: 0 % (ref 0–15)
WBC, Synovial: 1568 cells/uL — ABNORMAL HIGH (ref <150)

## 2020-07-30 ENCOUNTER — Other Ambulatory Visit: Payer: Self-pay

## 2020-07-30 ENCOUNTER — Encounter (HOSPITAL_COMMUNITY)
Admission: RE | Admit: 2020-07-30 | Discharge: 2020-07-30 | Disposition: A | Discharge status: Home / Self Care | Payer: Medicare Other | Source: Ambulatory Visit | Attending: Orthopedic Surgery | Admitting: Orthopedic Surgery

## 2020-07-30 DIAGNOSIS — Z01818 Encounter for other preprocedural examination: Secondary | ICD-10-CM | POA: Insufficient documentation

## 2020-07-30 LAB — CBC
HCT: 39.8 % (ref 39.0–52.0)
Hemoglobin: 13.4 g/dL (ref 13.0–17.0)
MCH: 33.1 pg (ref 26.0–34.0)
MCHC: 33.7 g/dL (ref 30.0–36.0)
MCV: 98.3 fL (ref 80.0–100.0)
Platelets: 223 10*3/uL (ref 150–400)
RBC: 4.05 MIL/uL — ABNORMAL LOW (ref 4.22–5.81)
RDW: 13.4 % (ref 11.5–15.5)
WBC: 5.1 10*3/uL (ref 4.0–10.5)
nRBC: 0 % (ref 0.0–0.2)

## 2020-07-30 LAB — BASIC METABOLIC PANEL
Anion gap: 8 (ref 5–15)
BUN: 15 mg/dL (ref 8–23)
CO2: 25 mmol/L (ref 22–32)
Calcium: 8.7 mg/dL — ABNORMAL LOW (ref 8.9–10.3)
Chloride: 106 mmol/L (ref 98–111)
Creatinine, Ser: 0.79 mg/dL (ref 0.61–1.24)
GFR, Estimated: >60 mL/min (ref >60)
Glucose, Bld: 94 mg/dL (ref 70–99)
Potassium: 3.9 mmol/L (ref 3.5–5.1)
Sodium: 139 mmol/L (ref 135–145)

## 2020-07-30 NOTE — Patient Instructions (Signed)
Your procedure is scheduled on: 08/01/2020  Report to [facility] at    6:15 AM.  Call this number if you have problems the morning of surgery: [phone]   Remember:   Do not Eat or Drink after midnight         No Smoking the morning of surgery  :  Take these medicines the morning of surgery with A SIP OF WATER: Amlodipine, Dilantin, Singulair   Do not wear jewelry, make-up or nail polish.  Do not wear lotions, powders, or perfumes. You may wear deodorant.  Do not shave 48 hours prior to surgery. Men may shave face and neck.  Do not bring valuables to the hospital.  Contacts, dentures or bridgework may not be worn into surgery.  Leave suitcase in the car. After surgery it may be brought to your room.  For patients admitted to the hospital, checkout time is 11:00 AM the day of discharge.   Patients discharged the day of surgery will not be allowed to drive home.    Special Instructions: Shower using CHG night before surgery and shower the day of surgery use CHG.  Use special wash - you have one bottle of CHG for all showers.  You should use approximately 1/2 of the bottle for each shower.  How to Use Chlorhexidine for Bathing Chlorhexidine gluconate (CHG) is a germ-killing (antiseptic) solution that is used to clean the skin. It can get rid of the bacteria that normally live on the skin and can keep them away for about 24 hours. To clean your skin with CHG, you may be given: A CHG solution to use in the shower or as part of a sponge bath. A prepackaged cloth that contains CHG. Cleaning your skin with CHG may help lower the risk for infection: While you are staying in the intensive care unit of the hospital. If you have a vascular access, such as a central line, to provide short-term or long-term access to your veins. If you have a catheter to drain urine from your bladder. If you are on a ventilator. A ventilator is a machine that helps you breathe by moving air in and  out of your lungs. After surgery. What are the risks? Risks of using CHG include: A skin reaction. Hearing loss, if CHG gets in your ears. Eye injury, if CHG gets in your eyes and is not rinsed out. The CHG product catching fire. Make sure that you avoid smoking and flames after applying CHG to your skin. Do not use CHG: If you have a chlorhexidine allergy or have previously reacted to chlorhexidine. On babies younger than 71 months of age. How to use CHG solution Use CHG only as told by your health care provider, and follow the instructions on the label. Use the full amount of CHG as directed. Usually, this is one bottle. During a shower Follow these steps when using CHG solution during a shower (unless your health care provider gives you different instructions): Start the shower. Use your normal soap and shampoo to wash your face and hair. Turn off the shower or move out of the shower stream. Pour the CHG onto a clean washcloth. Do not use any type of brush or rough-edged sponge. Starting at your neck, lather your body down to your toes. Make sure you follow these instructions: If you will be having surgery, pay special attention to the part of your body where you will be having surgery. Scrub this area for at  least 1 minute. Do not use CHG on your head or face. If the solution gets into your ears or eyes, rinse them well with water. Avoid your genital area. Avoid any areas of skin that have broken skin, cuts, or scrapes. Scrub your back and under your arms. Make sure to wash skin folds. Let the lather sit on your skin for 1-2 minutes or as long as told by your health care provider. Thoroughly rinse your entire body in the shower. Make sure that all body creases and crevices are rinsed well. Dry off with a clean towel. Do not put any substances on your body afterward--such as powder, lotion, or perfume--unless you are told to do so by your health care provider. Only use lotions that are  recommended by the manufacturer. Put on clean clothes or pajamas. If it is the night before your surgery, sleep in clean sheets.  During a sponge bath Follow these steps when using CHG solution during a sponge bath (unless your health care provider gives you different instructions): Use your normal soap and shampoo to wash your face and hair. Pour the CHG onto a clean washcloth. Starting at your neck, lather your body down to your toes. Make sure you follow these instructions: If you will be having surgery, pay special attention to the part of your body where you will be having surgery. Scrub this area for at least 1 minute. Do not use CHG on your head or face. If the solution gets into your ears or eyes, rinse them well with water. Avoid your genital area. Avoid any areas of skin that have broken skin, cuts, or scrapes. Scrub your back and under your arms. Make sure to wash skin folds. Let the lather sit on your skin for 1-2 minutes or as long as told by your health care provider. Using a different clean, wet washcloth, thoroughly rinse your entire body. Make sure that all body creases and crevices are rinsed well. Dry off with a clean towel. Do not put any substances on your body afterward--such as powder, lotion, or perfume--unless you are told to do so by your health care provider. Only use lotions that are recommended by the manufacturer. Put on clean clothes or pajamas. If it is the night before your surgery, sleep in clean sheets. How to use CHG prepackaged cloths Only use CHG cloths as told by your health care provider, and follow the instructions on the label. Use the CHG cloth on clean, dry skin. Do not use the CHG cloth on your head or face unless your health care provider tells you to. When washing with the CHG cloth: Avoid your genital area. Avoid any areas of skin that have broken skin, cuts, or scrapes. Before surgery Follow these steps when using a CHG cloth to clean before  surgery (unless your health care provider gives you different instructions): Using the CHG cloth, vigorously scrub the part of your body where you will be having surgery. Scrub using a back-and-forth motion for 3 minutes. The area on your body should be completely wet with CHG when you are done scrubbing. Do not rinse. Discard the cloth and let the area air-dry. Do not put any substances on the area afterward, such as powder, lotion, or perfume. Put on clean clothes or pajamas. If it is the night before your surgery, sleep in clean sheets.  For general bathing Follow these steps when using CHG cloths for general bathing (unless your health care provider gives you different instructions).  Use a separate CHG cloth for each area of your body. Make sure you wash between any folds of skin and between your fingers and toes. Wash your body in the following order, switching to a new cloth after each step: The front of your neck, shoulders, and chest. Both of your arms, under your arms, and your hands. Your stomach and groin area, avoiding the genitals. Your right leg and foot. Your left leg and foot. The back of your neck, your back, and your buttocks. Do not rinse. Discard the cloth and let the area air-dry. Do not put any substances on your body afterward--such as powder, lotion, or perfume--unless you are told to do so by your health care provider. Only use lotions that are recommended by the manufacturer. Put on clean clothes or pajamas. Contact a health care provider if: Your skin gets irritated after scrubbing. You have questions about using your solution or cloth. Get help right away if: Your eyes become very red or swollen. Your eyes itch badly. Your skin itches badly and is red or swollen. Your hearing changes. You have trouble seeing. You have swelling or tingling in your mouth or throat. You have trouble breathing. You swallow any chlorhexidine. Summary Chlorhexidine gluconate (CHG)  is a germ-killing (antiseptic) solution that is used to clean the skin. Cleaning your skin with CHG may help to lower your risk for infection. You may be given CHG to use for bathing. It may be in a bottle or in a prepackaged cloth to use on your skin. Carefully follow your health care provider's instructions and the instructions on the product label. Do not use CHG if you have a chlorhexidine allergy. Contact your health care provider if your skin gets irritated after scrubbing. This information is not intended to replace advice given to you by your health care provider. Make sure you discuss any questions you have with your healthcare provider. Document Revised: 05/26/2019 Document Reviewed: 06/30/2019 Elsevier Patient Education  [place].                                                                                                                                 Cyst Removal, Care After This sheet gives you information about how to care for yourself after your procedure. Your health care provider may also give you more specific instructions. If you have problems or questions, contact your health careprovider. What can I expect after the procedure? After the procedure, it is common to have: Soreness in the area where your cyst was removed. Tightness or itchiness from the stitches (sutures) in your skin. Follow these instructions at home: Medicines Take over-the-counter and prescription medicines only as told by your health care provider. If you were prescribed an antibiotic medicine or ointment, take or apply it as told by your health care provider. Do not stop using the antibiotic even if you start to feel better. Incision care  Follow instructions from  your health care provider about how to take care of your incision. Make sure you: Wash your hands with soap and water for at least 20 seconds before you change your bandage (dressing). If soap and water are not available, use hand  sanitizer. Change your dressing as told by your health care provider. Leave sutures, skin glue, or adhesive strips in place. These skin closures may need to stay in place for 1-2 weeks or longer. If adhesive strip edges start to loosen and curl up, you may trim the loose edges. Do not remove adhesive strips completely unless your health care provider tells you to do that. Keep the dressing dry until your health care provider says that it can be removed. After your dressing is off, check your incision area every day for signs of infection. Check for: Redness, swelling, or pain. Fluid or blood. Warmth. Pus or a bad smell.  General instructions Do not take baths, swim, or use a hot tub until your health care provider approves. Ask your health care provider if you may take showers. You may only be allowed to take sponge baths. Your health care provider may ask you to avoid contact sports or activities that take a lot of effort. Do not do anything that stretches or puts pressure on your incision. You can return to your normal diet. Keep all follow-up visits. This is important. Contact a health care provider if: You have a fever. You have redness, swelling, or pain in the incision area. You have fluid or blood coming from your incision. You have pus or a bad smell coming from your incision. Your incision feels warm to the touch. Your cyst grows back. Get help right away if: If the incision site suddenly increases in size and you have pain at the incision site. You may be checked for a collection of blood under the skin from the procedure (hematoma). Summary After the procedure, it is common to have soreness in the area where your cyst was removed. Take or apply over-the-counter and prescription medicines only as told by your health care provider. Follow instructions from your health care provider about how to take care of your incision. This information is not intended to replace advice given  to you by your health care provider. Make sure you discuss any questions you have with your healthcare provider. Document Revised: 04/19/2019 Document Reviewed: 04/19/2019 Elsevier Patient Education  [place] After This sheet gives you information about how to care for yourself after your procedure. Your health care provider may also give you more specific instructions. If you have problems or questions, contact your health careprovider. What can I expect after the procedure? After the procedure, it is common to have: Tiredness. Forgetfulness about what happened after the procedure. Impaired judgment for important decisions. Nausea or vomiting. Some difficulty with balance. Follow these instructions at home: For the time period you were told by your health care provider:     Rest as needed. Do not participate in activities where you could fall or become injured. Do not drive or use machinery. Do not drink alcohol. Do not take sleeping pills or medicines that cause drowsiness. Do not make important decisions or sign legal documents. Do not take care of children on your own. Eating and drinking Follow the diet that is recommended by your health care provider. Drink enough fluid to keep your urine pale yellow. If you vomit: Drink water, juice, or soup when you can drink without  vomiting. Make sure you have little or no nausea before eating solid foods. General instructions Have a responsible adult stay with you for the time you are told. It is important to have someone help care for you until you are awake and alert. Take over-the-counter and prescription medicines only as told by your health care provider. If you have sleep apnea, surgery and certain medicines can increase your risk for breathing problems. Follow instructions from your health care provider about wearing your sleep device: Anytime you are sleeping, including during daytime  naps. While taking prescription pain medicines, sleeping medicines, or medicines that make you drowsy. Avoid smoking. Keep all follow-up visits as told by your health care provider. This is important. Contact a health care provider if: You keep feeling nauseous or you keep vomiting. You feel light-headed. You are still sleepy or having trouble with balance after 24 hours. You develop a rash. You have a fever. You have redness or swelling around the IV site. Get help right away if: You have trouble breathing. You have new-onset confusion at home. Summary For several hours after your procedure, you may feel tired. You may also be forgetful and have poor judgment. Have a responsible adult stay with you for the time you are told. It is important to have someone help care for you until you are awake and alert. Rest as told. Do not drive or operate machinery. Do not drink alcohol or take sleeping pills. Get help right away if you have trouble breathing, or if you suddenly become confused. This information is not intended to replace advice given to you by your health care provider. Make sure you discuss any questions you have with your healthcare provider. Document Revised: 09/28/2019 Document Reviewed: 12/15/2018 Elsevier Patient Education  2022 [employer].

## 2020-07-31 NOTE — Discharge Instructions (Signed)
  Kaina Orengo A. Amedeo Kinsman, MD [facility] [address] [place],  [place]  [address] Phone: [phone] Fax: [phone]    Evaro may remove your bandage on postop day 3 and get the hand wet.  No ointments or lotions to be applied to the incision.  Do not submerge the incision for 1 month.  PAIN MEDICATIONS  Hydrocodone - narcotic pain medication, contains tylenol, do not take additional tylenol when taking this medications.  Take only as needed  Ibuprofen/Naproxen - available over the counter, take as needed  Zofran - take as needed for nausea  FOLLOW-UP If you develop a Fever (>101.5), Redness or Drainage from the surgical incision site, please call our office to arrange for an evaluation. Please call the office to schedule a follow-up appointment for your incision check if you do not already have one, 7-10 days post-operatively.  IF YOU HAVE ANY QUESTIONS, PLEASE FEEL FREE TO CALL OUR OFFICE.  HELPFUL INFORMATION  You should wean off your narcotic medicines as soon as you are able.  Most patients will be off or using minimal narcotics before their first postop appointment.   You may be more comfortable sleeping in a semi-seated position the first few nights following surgery.  Keep a pillow propped under the elbow and forearm for comfort.  If you have a recliner type of chair it might be beneficial.    We suggest you use the pain medication the first night prior to going to bed, in order to ease any pain when the anesthesia wears off. You should avoid taking pain medications on an empty stomach as it will make you nauseous.  Do not drink alcoholic beverages or take illicit drugs when taking pain medications.  You may return to work/school in the next couple of days when you feel up to it. Desk work and typing is fine.  Pain medication may make you constipated.  Below are a few solutions to try in this  order: Decrease the amount of pain medication if you aren't having pain. Drink lots of decaffeinated fluids. Drink prune juice and/or each dried prunes  If the first 3 don't work start with additional solutions Take Colace - an over-the-counter stool softener Take Senokot - an over-the-counter laxative Take Miralax - a stronger over-the-counter laxative

## 2020-08-01 ENCOUNTER — Encounter (HOSPITAL_COMMUNITY): Payer: Self-pay | Admitting: Orthopedic Surgery

## 2020-08-01 ENCOUNTER — Ambulatory Visit (HOSPITAL_COMMUNITY)
Admission: RE | Admit: 2020-08-01 | Discharge: 2020-08-01 | Disposition: A | Discharge status: Home / Self Care | Payer: Medicare Other | Attending: Orthopedic Surgery | Admitting: Orthopedic Surgery

## 2020-08-01 ENCOUNTER — Ambulatory Visit (HOSPITAL_COMMUNITY): Payer: Medicare Other | Admitting: Anesthesiology

## 2020-08-01 ENCOUNTER — Encounter (HOSPITAL_COMMUNITY)
Admission: RE | Disposition: A | Discharge status: Home / Self Care | Payer: Self-pay | Source: Home / Self Care | Attending: Orthopedic Surgery

## 2020-08-01 ENCOUNTER — Other Ambulatory Visit: Payer: Self-pay

## 2020-08-01 DIAGNOSIS — I10 Essential (primary) hypertension: Secondary | ICD-10-CM | POA: Insufficient documentation

## 2020-08-01 DIAGNOSIS — G40909 Epilepsy, unspecified, not intractable, without status epilepticus: Secondary | ICD-10-CM | POA: Diagnosis not present

## 2020-08-01 DIAGNOSIS — D179 Benign lipomatous neoplasm, unspecified: Secondary | ICD-10-CM

## 2020-08-01 DIAGNOSIS — D1721 Benign lipomatous neoplasm of skin and subcutaneous tissue of right arm: Secondary | ICD-10-CM | POA: Diagnosis not present

## 2020-08-01 DIAGNOSIS — D1739 Benign lipomatous neoplasm of skin and subcutaneous tissue of other sites: Secondary | ICD-10-CM | POA: Diagnosis not present

## 2020-08-01 DIAGNOSIS — D1730 Benign lipomatous neoplasm of skin and subcutaneous tissue of unspecified sites: Secondary | ICD-10-CM | POA: Diagnosis not present

## 2020-08-01 DIAGNOSIS — Z8546 Personal history of malignant neoplasm of prostate: Secondary | ICD-10-CM | POA: Insufficient documentation

## 2020-08-01 DIAGNOSIS — Z96651 Presence of right artificial knee joint: Secondary | ICD-10-CM | POA: Diagnosis not present

## 2020-08-01 DIAGNOSIS — Z888 Allergy status to other drugs, medicaments and biological substances status: Secondary | ICD-10-CM | POA: Diagnosis not present

## 2020-08-01 DIAGNOSIS — C61 Malignant neoplasm of prostate: Secondary | ICD-10-CM

## 2020-08-01 DIAGNOSIS — Z87891 Personal history of nicotine dependence: Secondary | ICD-10-CM | POA: Diagnosis not present

## 2020-08-01 HISTORY — PX: CYST EXCISION: SHX5701

## 2020-08-01 SURGERY — CYST REMOVAL
Anesthesia: General | Site: Thumb | Laterality: Right

## 2020-08-01 MED ORDER — ORAL CARE MOUTH RINSE: 15.0000 mL | Freq: Once | OROMUCOSAL | Status: AC

## 2020-08-01 MED ORDER — FENTANYL CITRATE (PF) 100 MCG/2ML IJ SOLN
INTRAMUSCULAR | Status: AC
  Filled 2020-08-01: qty 2

## 2020-08-01 MED ORDER — CHLORHEXIDINE GLUCONATE 0.12 % MT SOLN
15.0000 mL | Freq: Once | OROMUCOSAL | Status: AC
  Administered 2020-08-01: 15 mL via OROMUCOSAL
  Filled 2020-08-01: qty 15

## 2020-08-01 MED ORDER — MIDAZOLAM HCL 2 MG/2ML IJ SOLN
INTRAMUSCULAR | Status: AC
  Filled 2020-08-01: qty 2

## 2020-08-01 MED ORDER — LACTATED RINGERS IV SOLN: INTRAVENOUS | Status: DC

## 2020-08-01 MED ORDER — FENTANYL CITRATE (PF) 100 MCG/2ML IJ SOLN
25.0000 ug | INTRAMUSCULAR | Status: DC | PRN
  Administered 2020-08-01: 25 ug via INTRAVENOUS

## 2020-08-01 MED ORDER — LIDOCAINE HCL 1 % IJ SOLN
INTRAMUSCULAR | Status: DC | PRN
  Administered 2020-08-01: 10 mL via INTRADERMAL

## 2020-08-01 MED ORDER — ONDANSETRON HCL 4 MG/2ML IJ SOLN: 4.0000 mg | Freq: Once | INTRAMUSCULAR | Status: DC | PRN

## 2020-08-01 MED ORDER — MIDAZOLAM HCL 5 MG/5ML IJ SOLN
INTRAMUSCULAR | Status: DC | PRN
  Administered 2020-08-01: 1 mg via INTRAVENOUS

## 2020-08-01 MED ORDER — KETOROLAC TROMETHAMINE 30 MG/ML IJ SOLN
INTRAMUSCULAR | Status: AC
  Filled 2020-08-01: qty 1

## 2020-08-01 MED ORDER — FENTANYL CITRATE (PF) 100 MCG/2ML IJ SOLN
INTRAMUSCULAR | Status: DC | PRN
  Administered 2020-08-01 (×2): 25 ug via INTRAVENOUS

## 2020-08-01 MED ORDER — ONDANSETRON HCL 4 MG/2ML IJ SOLN
INTRAMUSCULAR | Status: DC | PRN
  Administered 2020-08-01: 4 mg via INTRAVENOUS

## 2020-08-01 MED ORDER — LIDOCAINE 2% (20 MG/ML) 5 ML SYRINGE
INTRAMUSCULAR | Status: DC | PRN
  Administered 2020-08-01: 40 mg via INTRAVENOUS

## 2020-08-01 MED ORDER — BUPIVACAINE HCL (PF) 0.5 % IJ SOLN
INTRAMUSCULAR | Status: AC
  Filled 2020-08-01: qty 30

## 2020-08-01 MED ORDER — PROPOFOL 500 MG/50ML IV EMUL
INTRAVENOUS | Status: DC | PRN
  Administered 2020-08-01: 25 ug/kg/min via INTRAVENOUS

## 2020-08-01 MED ORDER — ONDANSETRON HCL 4 MG/2ML IJ SOLN
INTRAMUSCULAR | Status: AC
  Filled 2020-08-01: qty 2

## 2020-08-01 MED ORDER — CEFAZOLIN SODIUM-DEXTROSE 2-4 GM/100ML-% IV SOLN
2.0000 g | INTRAVENOUS | Status: AC
  Administered 2020-08-01: 2 g via INTRAVENOUS
  Filled 2020-08-01: qty 100

## 2020-08-01 MED ORDER — PROPOFOL 10 MG/ML IV BOLUS
INTRAVENOUS | Status: AC
  Filled 2020-08-01: qty 40

## 2020-08-01 MED ORDER — ONDANSETRON HCL 4 MG PO TABS: 4.0000 mg | ORAL_TABLET | Freq: Three times a day (TID) | ORAL | 0 refills | Status: AC | PRN

## 2020-08-01 MED ORDER — HYDROCODONE-ACETAMINOPHEN 5-325 MG PO TABS: 1.0000 | ORAL_TABLET | ORAL | 0 refills | Status: AC | PRN

## 2020-08-01 MED ORDER — SODIUM CHLORIDE 0.9 % IR SOLN
Status: DC | PRN
  Administered 2020-08-01: 1000 mL

## 2020-08-01 MED ORDER — LIDOCAINE HCL (PF) 1 % IJ SOLN
INTRAMUSCULAR | Status: AC
  Filled 2020-08-01: qty 30

## 2020-08-01 MED ORDER — PROPOFOL 10 MG/ML IV BOLUS
INTRAVENOUS | Status: DC | PRN
  Administered 2020-08-01: 50 mg via INTRAVENOUS

## 2020-08-01 MED ORDER — LIDOCAINE HCL (PF) 2 % IJ SOLN
INTRAMUSCULAR | Status: AC
  Filled 2020-08-01: qty 5

## 2020-08-01 MED ORDER — KETOROLAC TROMETHAMINE 30 MG/ML IJ SOLN
INTRAMUSCULAR | Status: DC | PRN
  Administered 2020-08-01: 30 mg via INTRAVENOUS

## 2020-08-01 SURGICAL SUPPLY — 37 items
APL PRP STRL LF DISP 70% ISPRP (MISCELLANEOUS) ×1
BANDAGE CONFORM 2  STR LF (GAUZE/BANDAGES/DRESSINGS) ×4 IMPLANT
BANDAGE ESMARK 4X12 BL STRL LF (DISPOSABLE) ×1 IMPLANT
BLADE SURG 15 STRL LF DISP TIS (BLADE) ×1 IMPLANT
BLADE SURG 15 STRL SS (BLADE) ×2
BNDG CMPR 12X4 ELC STRL LF (DISPOSABLE) ×1
BNDG CONFORM 2 STRL LF (GAUZE/BANDAGES/DRESSINGS) ×2 IMPLANT
BNDG ESMARK 4X12 BLUE STRL LF (DISPOSABLE) ×2
CHLORAPREP W/TINT 26 (MISCELLANEOUS) ×2 IMPLANT
CLOTH BEACON ORANGE TIMEOUT ST (SAFETY) ×2 IMPLANT
COVER LIGHT HANDLE STERIS (MISCELLANEOUS) ×4 IMPLANT
CUFF TOURN SGL QUICK 18X4 (TOURNIQUET CUFF) ×2 IMPLANT
ELECT NEEDLE TIP 2.8 STRL (NEEDLE) ×2 IMPLANT
ELECT REM PT RETURN 9FT ADLT (ELECTROSURGICAL) ×2
ELECTRODE REM PT RTRN 9FT ADLT (ELECTROSURGICAL) ×1 IMPLANT
GAUZE 4X4 16PLY ~~LOC~~+RFID DBL (SPONGE) ×2 IMPLANT
GAUZE SPONGE 4X4 12PLY STRL (GAUZE/BANDAGES/DRESSINGS) ×2 IMPLANT
GAUZE XEROFORM 1X8 LF (GAUZE/BANDAGES/DRESSINGS) ×2 IMPLANT
GLOVE SKINSENSE NS SZ8.0 LF (GLOVE) ×3
GLOVE SKINSENSE STRL SZ8.0 LF (GLOVE) ×3 IMPLANT
GLOVE SRG 8 PF TXTR STRL LF DI (GLOVE) ×1 IMPLANT
GLOVE SURG UNDER POLY LF SZ7 (GLOVE) ×4 IMPLANT
GLOVE SURG UNDER POLY LF SZ8 (GLOVE) ×2
GOWN STRL REUS W/ TWL XL LVL3 (GOWN DISPOSABLE) ×1 IMPLANT
GOWN STRL REUS W/TWL LRG LVL3 (GOWN DISPOSABLE) ×2 IMPLANT
GOWN STRL REUS W/TWL XL LVL3 (GOWN DISPOSABLE) ×2
KIT TURNOVER KIT A (KITS) ×2 IMPLANT
MANIFOLD NEPTUNE II (INSTRUMENTS) ×2 IMPLANT
NEEDLE HYPO 18GX1.5 BLUNT FILL (NEEDLE) ×4 IMPLANT
NEEDLE HYPO 21X1.5 SAFETY (NEEDLE) ×2 IMPLANT
NS IRRIG 1000ML POUR BTL (IV SOLUTION) ×2 IMPLANT
PACK BASIC LIMB (CUSTOM PROCEDURE TRAY) ×2 IMPLANT
POSITIONER HAND ALUMI XLG (MISCELLANEOUS) IMPLANT
SET BASIN LINEN APH (SET/KITS/TRAYS/PACK) ×2 IMPLANT
SUT PROLENE 3 0 PS 2 (SUTURE) ×2 IMPLANT
SYR 30ML LL (SYRINGE) ×4 IMPLANT
SYR CONTROL 10ML LL (SYRINGE) ×4 IMPLANT

## 2020-08-01 NOTE — Anesthesia Preprocedure Evaluation (Signed)
Anesthesia Evaluation  Patient identified by MRN, date of birth, ID band Patient awake    Reviewed: Allergy & Precautions, NPO status , Patient's Chart, lab work & pertinent test results  Airway Mallampati: III  TM Distance: >3 FB Neck ROM: Full    Dental  (+) Dental Advisory Given, Missing, Chipped   Pulmonary former smoker,    Pulmonary exam normal breath sounds clear to auscultation       Cardiovascular Exercise Tolerance: Good hypertension, Pt. on medications Normal cardiovascular exam Rhythm:Regular Rate:Bradycardia - Systolic murmurs, - Diastolic murmurs, - Friction Rub, - Carotid Bruit, - Peripheral Edema and - Systolic Click [id number] 35:00:93 [place] [facility]-300 ROUTINE RECORD August 17, 1945 (5 yr) Male Caucasian Room: Loc:903 Technician: Test ind: Vent. rate 50 BPM PR interval 174 ms QRS duration 70 ms QT/QTcB 390/355 ms P-R-T axes 37 53 26 Sinus bradycardia Anterior infarct , age undetermined New since previous tracing Abnormal ECG Confirmed by Kirk Ruths [phone]) on 07/30/2020 12:23:37 PM   Neuro/Psych Seizures -, Well Controlled,  Traumatic brain injury negative psych ROS   GI/Hepatic negative GI ROS, Neg liver ROS,   Endo/Other  negative endocrine ROS  Renal/GU negative Renal ROS     Musculoskeletal  (+) Arthritis  (back sx),   Abdominal   Peds  Hematology negative hematology ROS (+)   Anesthesia Other Findings   Reproductive/Obstetrics                             Anesthesia Physical Anesthesia Plan  ASA: 2  Anesthesia Plan: General   Post-op Pain Management:    Induction: Intravenous  PONV Risk Score and Plan: Ondansetron and Propofol infusion  Airway Management Planned: Natural Airway, Nasal Cannula and Simple Face Mask  Additional Equipment:   Intra-op Plan:   Post-operative Plan:   Informed Consent: I have reviewed the patients  History and Physical, chart, labs and discussed the procedure including the risks, benefits and alternatives for the proposed anesthesia with the patient or authorized representative who has indicated his/her understanding and acceptance.     Dental advisory given  Plan Discussed with: CRNA and Surgeon  Anesthesia Plan Comments:         Anesthesia Quick Evaluation

## 2020-08-01 NOTE — Op Note (Addendum)
Orthopaedic Surgery Operative Note (CSN: [id number])  Darin Williamson  01-02-1946 Date of Surgery: 08/01/2020   Diagnoses:  Right thumb cyst  Procedure: Excision of mass from right thumb   Operative Finding Successful completion of the planned procedure.  Mass from thumb consistent with a lipoma.  Mass sent to pathology for confirmation.     Post-Op Diagnosis: Right thumb lipoma Surgeons:Primary: Mordecai Rasmussen, MD Assistants: Ivan Croft Location: [facility] OR ROOM 4 Anesthesia: Sedation plus regional anesthesia Antibiotics: Ancef 2 g Tourniquet time:  Total Tourniquet Time Documented: Upper Arm (Right) - 13 minutes Upper Arm (Right) - 18 minutes Total: Upper Arm (Right) - 31 minutes  Estimated Blood Loss: Minimal Complications: None Specimens: Mass from right thumb - consistency of fatty tissue.  Sent to pathology.  Implants: * No implants in log *  Indications for Surgery:   Darin Williamson is a 75 y.o. male with a prominent growth on the distal aspect of his right thumb.  It has been there for several years.  Iit has not changed in size.  Due to the appearance, and the size of this mass, he is interested in having it excised.  Benefits and risks of operative and nonoperative management were discussed prior to surgery with patient and informed consent form was completed.  Specific risks including infection, need for additional surgery, bleeding, damage to surrounding structures including blood vessels and nerves, and more severe complications associated with anesthesia were discussed.  He elected to proceed.   Procedure:   The patient was identified properly. Informed consent was obtained and the surgical site was marked. The patient was taken to the OR where regional anesthesia and sedation was induced.  The patient was positioned supine with a hand table.  The right arm was prepped and draped in the usual sterile fashion.  Timeout was performed before the beginning of the case.  Tourniquet  was used for the above duration.  He received Ancef prior to making incision.  The mass was identified on the ulnar aspect of the distal thumb.  It was less than 1 cm.  The mass was compressible.  We made a curvilinear incision starting proximal to the mass, extending along the volar border of the mass to the distal tip of the thumb.  We incised sharply through skin.  We then dissected carefully.  The neurovascular bundle was not visualized.  The mass was identified and circumferentially released.  The mass was most consistent with fatty tissue.  Using primarily blunt dissection, we gained control of the mass in a circumferential fashion and it was subsequently removed.  It was not encapsulated, but did come out en masse.  It measured approximately 1 x 1 cm.  The area of the thumb was immediately decompressed.  The mass was subsequently sent to pathology for confirmation.  After removal of the mass, we identified some redundant skin.  The skin was closed over the previous pocket, where the mass was excised from.  We then excised the redundant skin sharply with a knife.  We were happy with the apposition of the skin at this point.  We let the tourniquet down for a couple of minutes to ensure that there is no excessive bleeding.  No pulsatile bleeding was identified.  The tourniquet was then inflated once again.  We irrigated the wound copiously.  We closed the incision with Prolene sutures using a combination of horizontal mattress and simple sutures.  A bulky sterile dressing was placed.  Patient was awoken  taken to PACU in stable condition.  Post-operative plan:  The patient will be discharged from the PACU, once he is stable. He will be nonweightbearing on the right thumb. DVT prophylaxis not indicated in this ambulatory upper extremity patient without significant risk factors.    Pain control with PRN pain medication preferring oral medicines.   Follow up plan will be scheduled in approximately 10-14  days for incision check. We will continue to monitor the pathology.

## 2020-08-01 NOTE — Anesthesia Procedure Notes (Signed)
Date/Time: 08/01/2020 7:45 AM Performed by: Orlie Dakin, CRNA Pre-anesthesia Checklist: Patient identified, Emergency Drugs available, Suction available and Patient being monitored Patient Re-evaluated:Patient Re-evaluated prior to induction Oxygen Delivery Method: Non-rebreather mask Induction Type: IV induction Placement Confirmation: positive ETCO2

## 2020-08-01 NOTE — Brief Op Note (Signed)
08/01/2020  8:30 AM  PATIENT:  Darin Williamson  75 y.o. male  PRE-OPERATIVE DIAGNOSIS:  Right thumb cyst  POST-OPERATIVE DIAGNOSIS:  Right thumb lipoma  PROCEDURE:  Procedure(s): Excision of cyst from distal right thumb (Right)  SURGEON:  Surgeon(s) and Role:    Mordecai Rasmussen, MD - Primary  PHYSICIAN ASSISTANT: N/A  ASSISTANTS: none   ANESTHESIA:   local and IV sedation  EBL:  10 cc   BLOOD ADMINISTERED:none  DRAINS: none   LOCAL MEDICATIONS USED:  MARCAINE    and LIDOCAINE   SPECIMEN:  Biopsy / Limited Resection  DISPOSITION OF SPECIMEN:  PATHOLOGY  COUNTS:  YES  TOURNIQUET:   Total Tourniquet Time Documented: Upper Arm (Right) - 13 minutes Upper Arm (Right) - 18 minutes Total: Upper Arm (Right) - 31 minutes   DICTATION: .Note written in EPIC  PLAN OF CARE: Discharge to home after PACU  PATIENT DISPOSITION:  PACU - hemodynamically stable.   Delay start of Pharmacological VTE agent (>24hrs) due to surgical blood loss or risk of bleeding: no

## 2020-08-01 NOTE — Anesthesia Postprocedure Evaluation (Signed)
Anesthesia Post Note  Patient: Darin Williamson  Procedure(s) Performed: Excision of cyst from distal right thumb (Right: Thumb)  Patient location during evaluation: PACU Anesthesia Type: General Level of consciousness: awake and alert and oriented Pain management: pain level controlled Vital Signs Assessment: post-procedure vital signs reviewed and stable Respiratory status: spontaneous breathing, nonlabored ventilation and respiratory function stable Cardiovascular status: blood pressure returned to baseline and stable Postop Assessment: no apparent nausea or vomiting Anesthetic complications: no   No notable events documented.   Last Vitals:  Vitals:   08/01/20 0845 08/01/20 0904  BP: (!) 168/83 (!) 169/91  Pulse: 64   Resp: 17 16  Temp:  36.7 C  SpO2: 98% 99%    Last Pain:  Vitals:   08/01/20 0904  TempSrc: Axillary  PainSc: 0-No pain                 Giovannina Mun C Ulyses Panico

## 2020-08-01 NOTE — Interval H&P Note (Signed)
History and Physical Interval Note:  08/01/2020 7:05 AM  Darin Williamson  has presented today for surgery, with the diagnosis of Right thumb cyst.  The various methods of treatment have been discussed with the patient and family. After consideration of risks, benefits and other options for treatment, the patient has consented to  Procedure(s): Excision of cyst from distal right thumb (Right) as a surgical intervention.  The patient's history has been reviewed, patient examined, no change in status, stable for surgery.  I have reviewed the patient's chart and labs.  Questions were answered to the patient's satisfaction.     Mordecai Rasmussen

## 2020-08-01 NOTE — Transfer of Care (Signed)
Immediate Anesthesia Transfer of Care Note  Patient: Darin Williamson  Procedure(s) Performed: Excision of cyst from distal right thumb (Right: Thumb)  Patient Location: PACU  Anesthesia Type:General  Level of Consciousness: awake, alert  and oriented  Airway & Oxygen Therapy: Patient Spontanous Breathing  Post-op Assessment: Report given to RN, Post -op Vital signs reviewed and stable and Patient moving all extremities X 4  Post vital signs: Reviewed and stable  Last Vitals:  Vitals Value Taken Time  BP 161/88 08/01/20 0823  Temp    Pulse 45 08/01/20 0824  Resp 16 08/01/20 0824  SpO2 97 % 08/01/20 0824  Vitals shown include unvalidated device data.  Last Pain:  Vitals:   08/01/20 0640  TempSrc: Oral  PainSc: 0-No pain         Complications: No notable events documented.

## 2020-08-02 ENCOUNTER — Encounter (HOSPITAL_COMMUNITY): Payer: Self-pay | Admitting: Orthopedic Surgery

## 2020-08-02 LAB — SURGICAL PATHOLOGY

## 2020-08-20 ENCOUNTER — Ambulatory Visit (INDEPENDENT_AMBULATORY_CARE_PROVIDER_SITE_OTHER): Payer: Medicare Other | Admitting: Orthopedic Surgery

## 2020-08-20 ENCOUNTER — Other Ambulatory Visit: Payer: Self-pay

## 2020-08-20 ENCOUNTER — Encounter: Payer: Self-pay | Admitting: Orthopedic Surgery

## 2020-08-20 VITALS — Ht 69.0 in | Wt 191.0 lb

## 2020-08-20 DIAGNOSIS — D179 Benign lipomatous neoplasm, unspecified: Secondary | ICD-10-CM

## 2020-08-20 NOTE — Progress Notes (Signed)
Orthopaedic Postop Note  Assessment: Darin Williamson is a 75 y.o. male s/p mass excision from distal right thumb; pathology consistent with angiolipoma  DOS: 08/01/20  Plan: Sutures were removed in clinic today.  Pathology was reviewed with the patient.  He is not having any pain.  Advised him to continue to monitor the incision.  Okay to get the hands dirty.  Soap and water is okay, but do not scrub or dried vigorously.  We briefly discussed removal of the mass from his volar left forearm.  We can discuss this in more detail at the next visit.  Follow-up in 6 weeks for repeat evaluation.   Follow-up: Return in about 6 weeks (around 10/01/2020). XR at next visit: None  Subjective:  Chief Complaint  Patient presents with   Routine Post Op    Cyst removal DOS: 08/01/20    History of Present Illness: Darin Williamson is a 74 y.o. male who presents following the above stated procedure.  He is doing well overall.  He is not having any pain in his right thumb.  He is pleased with the procedure and recovery thus far.  He denies fevers or chills.  No numbness or tingling.  Review of Systems: No fevers or chills No numbness or tingling No Chest Pain No shortness of breath    Objective: Ht '5\' 9"'$  (1.753 m)   Wt 191 lb (86.6 kg)   BMI 28.21 kg/m   Physical Exam:  Alert and oriented.  No acute distress.  Normal gait.  Surgical incision on the distal right thumb is healing well.  No surrounding erythema or drainage.  No tenderness to palpation.  He is able to make a full fist.  Distal thumb is warm and well-perfused.  Excellent capillary refill.  Sensation to the distal thumb is intact.    IMAGING: I personally ordered and reviewed the following images:  No new imaging obtained today.  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  Mordecai Rasmussen, MD 08/20/2020 10:18 PM

## 2020-09-04 ENCOUNTER — Other Ambulatory Visit: Payer: Self-pay | Admitting: Urology

## 2020-09-04 DIAGNOSIS — C61 Malignant neoplasm of prostate: Secondary | ICD-10-CM

## 2020-09-23 ENCOUNTER — Other Ambulatory Visit: Payer: Self-pay | Admitting: Orthopaedic Surgery

## 2020-09-23 DIAGNOSIS — R29898 Other symptoms and signs involving the musculoskeletal system: Secondary | ICD-10-CM

## 2020-10-01 ENCOUNTER — Encounter: Payer: Self-pay | Admitting: Orthopedic Surgery

## 2020-10-01 ENCOUNTER — Ambulatory Visit (INDEPENDENT_AMBULATORY_CARE_PROVIDER_SITE_OTHER): Payer: Medicare Other | Admitting: Orthopedic Surgery

## 2020-10-01 ENCOUNTER — Other Ambulatory Visit: Payer: Self-pay

## 2020-10-01 ENCOUNTER — Encounter: Payer: Self-pay | Admitting: *Deleted

## 2020-10-01 VITALS — Ht 69.0 in | Wt 191.0 lb

## 2020-10-01 DIAGNOSIS — D179 Benign lipomatous neoplasm, unspecified: Secondary | ICD-10-CM

## 2020-10-01 NOTE — Progress Notes (Signed)
Orthopaedic Postop Note  Assessment: Darin Williamson is a 75 y.o. male s/p mass excision from distal right thumb; pathology consistent with angiolipoma  DOS: 08/01/20  Plan: Patient has healed very well following surgery.  Reports no pain.  No stiffness in his right hand.  He is very pleased with the progress to date.  He does not need anything further at this time.  He does have a mass on the volar left forearm which she would like removed, but he is willing to wait at this time.  He will contact us if he wishes to proceed with surgery on his left forearm.  All questions were answered, and he is amenable to this plan.  Follow-up: Return if symptoms worsen or fail to improve. XR at next visit: None  Subjective:  Chief Complaint  Patient presents with   Routine Post Op    Rt thumb lipoma removal DOS 07/31/20    History of Present Illness: Darin Williamson is a 75 y.o. male who presents following the above stated procedure.  He has healed very well.  No issues with the surgical incision.  No numbness or tingling around the right thumb.  He denies pain.  No stiffness of the right hand.  He is pleased with his progress to date.  He also has a mass on the volar left forearm, which has been there for many years.  He is not interested in surgery at this time.  He will let us know if he wishes to proceed with surgery.  Review of Systems: No fevers or chills No numbness or tingling No Chest Pain No shortness of breath    Objective: Ht '5\' 9"'$  (1.753 m)   Wt 191 lb (86.6 kg)   BMI 28.21 kg/m   Physical Exam:  Alert and oriented.  No acute distress.  Normal gait.  Surgical incision to the right thumb is barely visible.  He has no tenderness to palpation at the site of the incision.  Sensation is intact throughout the right thumb.  Contour of the thumb is similar to the contralateral side.  He is able to make a full fist.  5/5 grip strength.  2+ radial pulse.  IMAGING: I personally ordered  and reviewed the following images:  No new imaging obtained today.  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  Mordecai Rasmussen, MD 10/01/2020 10:49 AM

## 2020-10-11 ENCOUNTER — Telehealth: Payer: Self-pay

## 2020-10-11 DIAGNOSIS — C61 Malignant neoplasm of prostate: Secondary | ICD-10-CM

## 2020-10-11 MED ORDER — OXYBUTYNIN CHLORIDE ER 5 MG PO TB24: 5.0000 mg | ORAL_TABLET | Freq: Every day | ORAL | 3 refills | Status: DC

## 2020-10-11 NOTE — Telephone Encounter (Signed)
Patient needing his refill to go to a new pharmacy.  Cancel 90 day supply at [facility]  oxybutynin (DITROPAN-XL) 5 MG 24 hr tablet  New pharmacy: [facility]  ([place], [place] Phone:  [phone]  Fax:  [phone]     Thanks, Helene Kelp

## 2020-10-11 NOTE — Telephone Encounter (Signed)
Rx sent to mail order

## 2020-10-22 DIAGNOSIS — Z23 Encounter for immunization: Secondary | ICD-10-CM | POA: Diagnosis not present

## 2020-10-25 ENCOUNTER — Encounter: Payer: Self-pay | Admitting: Internal Medicine

## 2020-10-25 ENCOUNTER — Other Ambulatory Visit: Payer: Self-pay

## 2020-10-25 ENCOUNTER — Ambulatory Visit (INDEPENDENT_AMBULATORY_CARE_PROVIDER_SITE_OTHER): Payer: Medicare Other | Admitting: Internal Medicine

## 2020-10-25 ENCOUNTER — Telehealth: Payer: Self-pay

## 2020-10-25 VITALS — BP 163/83 | HR 55 | Temp 97.3°F | Ht 69.0 in | Wt 189.8 lb

## 2020-10-25 DIAGNOSIS — K5909 Other constipation: Secondary | ICD-10-CM | POA: Diagnosis not present

## 2020-10-25 DIAGNOSIS — Z1211 Encounter for screening for malignant neoplasm of colon: Secondary | ICD-10-CM | POA: Diagnosis not present

## 2020-10-25 NOTE — Telephone Encounter (Signed)
Pt informed we will call him to schedule TCS w/Propofol ASA 2 when Dr. Roseanne Kaufman next schedule is available.
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
## 2020-10-25 NOTE — Patient Instructions (Signed)
It was good to see you again today!  As discussed, we will schedule a screening colonoscopy (ASA 2-propofol)  Constipation information provided  Use MiraLAX 17 g (or 1 capful) powder in 8 ounces of water-take at bedtime every night as needed for constipation.  Further recommendations to follow after colonoscopy has been performed.

## 2020-10-28 DIAGNOSIS — H40013 Open angle with borderline findings, low risk, bilateral: Secondary | ICD-10-CM | POA: Diagnosis not present

## 2020-10-28 DIAGNOSIS — Z961 Presence of intraocular lens: Secondary | ICD-10-CM | POA: Diagnosis not present

## 2020-10-28 DIAGNOSIS — H25811 Combined forms of age-related cataract, right eye: Secondary | ICD-10-CM | POA: Diagnosis not present

## 2020-11-06 MED ORDER — PEG 3350-KCL-NA BICARB-NACL 420 G PO SOLR
ORAL | 0 refills | Status: DC
Start: 2020-11-06 — End: 2021-07-11

## 2020-11-06 NOTE — Addendum Note (Signed)
Addended by: Cheron Every on: 11/06/2020 09:07 AM   Modules accepted: Orders

## 2020-11-06 NOTE — Telephone Encounter (Signed)
CALLED PT. He has been scheduled for TCS W/ PROPOFOL (ASA 2) Dr. Gala Romney on 11/7 at [place] will mail prep instructions. He requesting I [facility].

## 2020-11-12 IMAGING — US US EXTREM LOW VENOUS*R*
1 series · 13 of 24 positions shown · non-contrast
Comparison: None.

CLINICAL DATA: 73-year-old male with right lower extremity calf
pain status post total knee replacement 1 month previously.



[Series 1: us extrem low venous*right* · 13 of 35 slices shown]
[im 1/35]
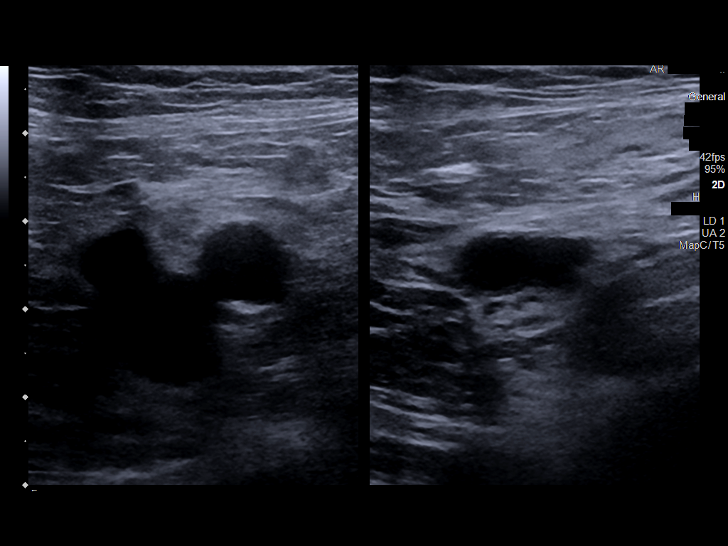
[im 3/35]
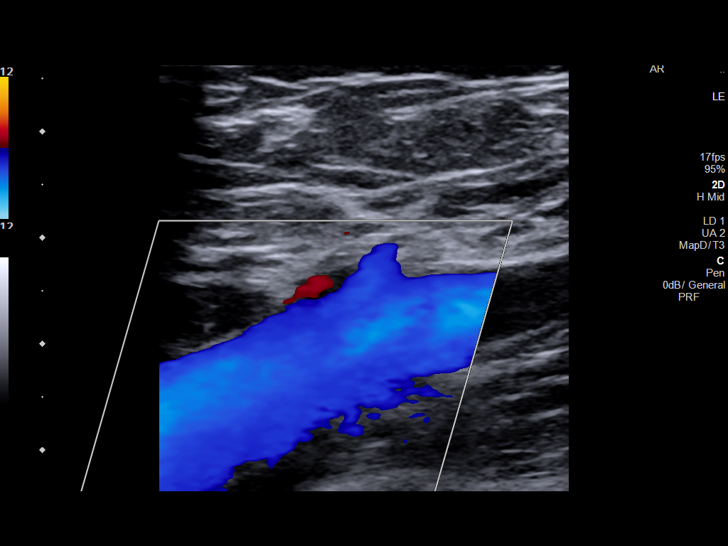
[im 6/35]
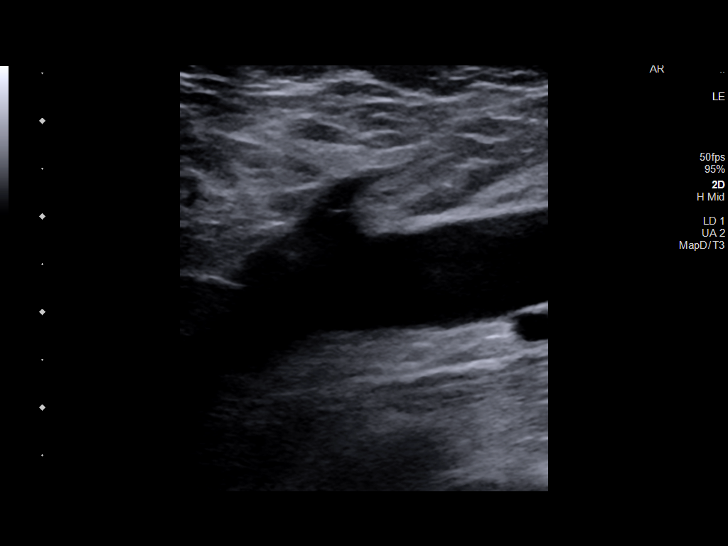
[im 9/35]
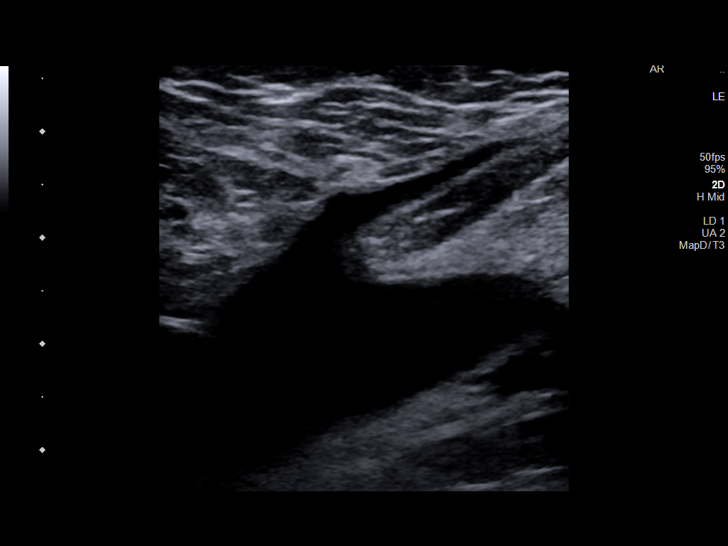
[im 12/35]
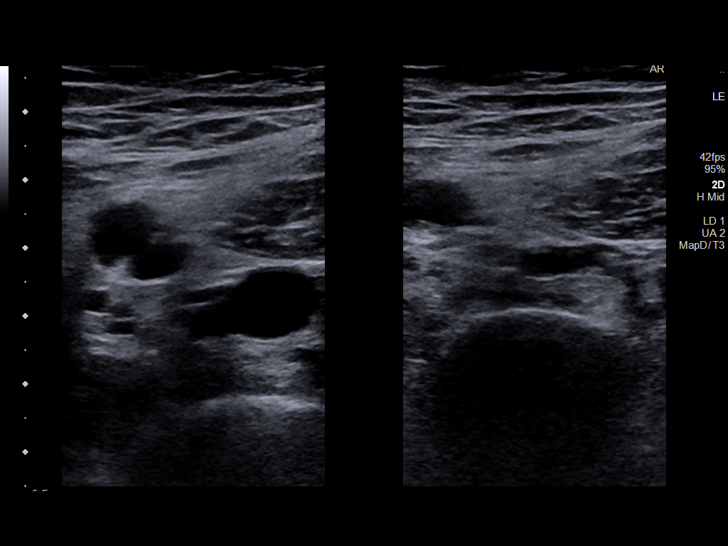
[im 15/35]
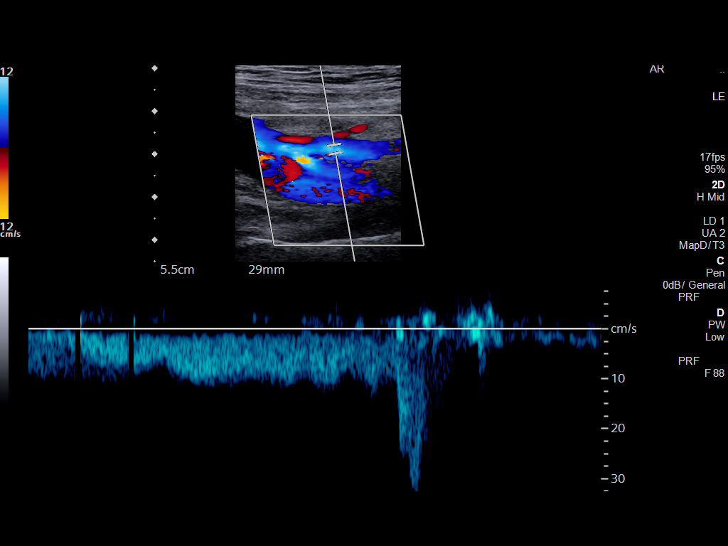
[im 18/35]
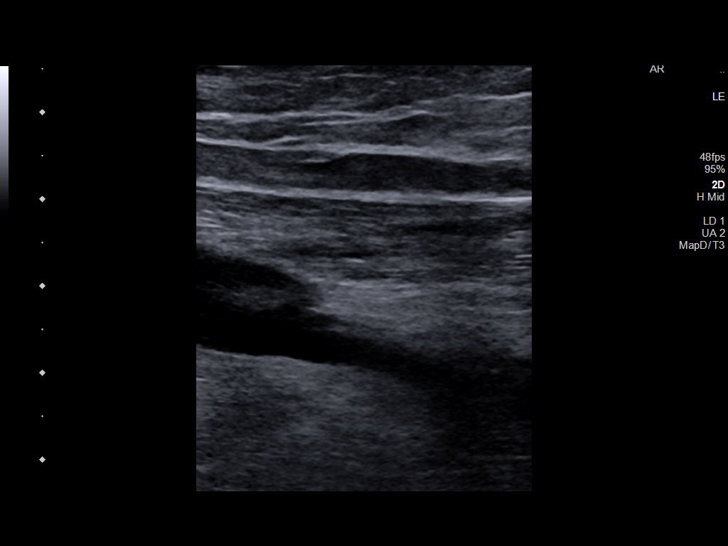
[im 20/35]
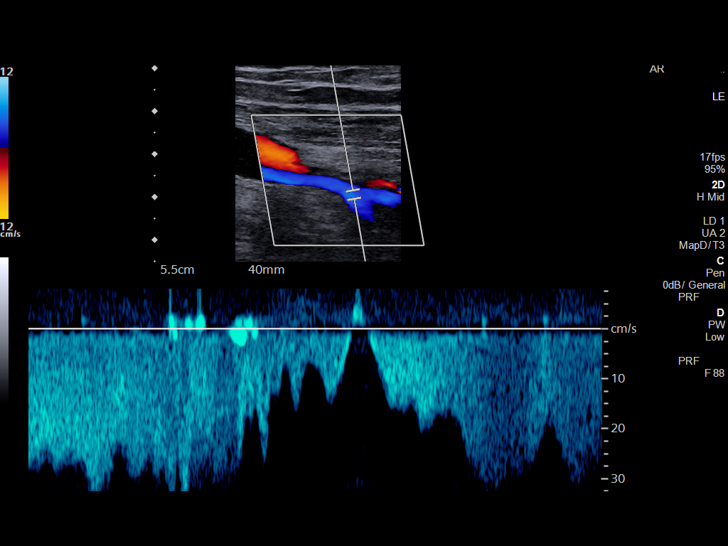
[im 23/35]
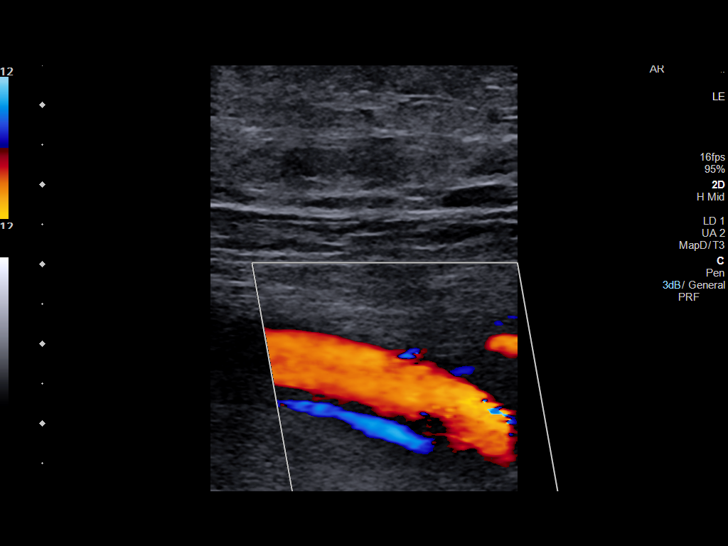
[im 26/35]
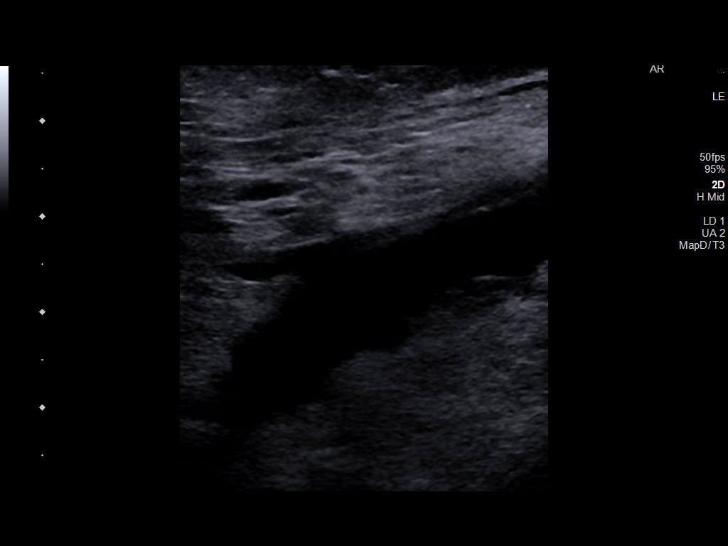
[im 29/35]
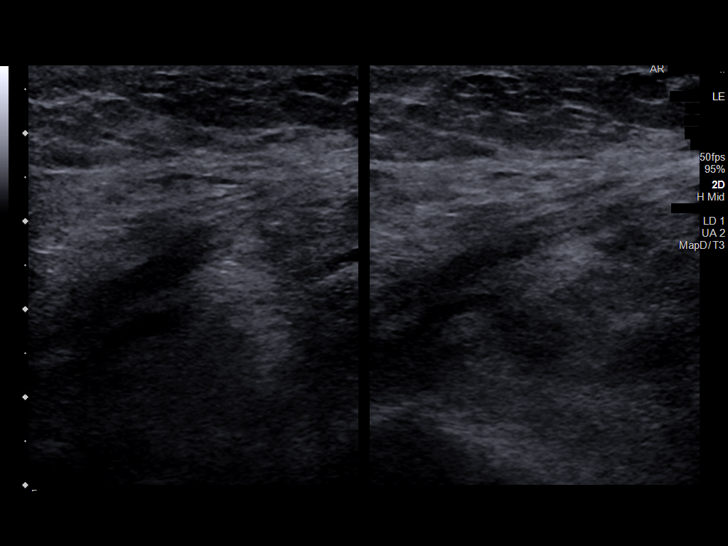
[im 32/35]
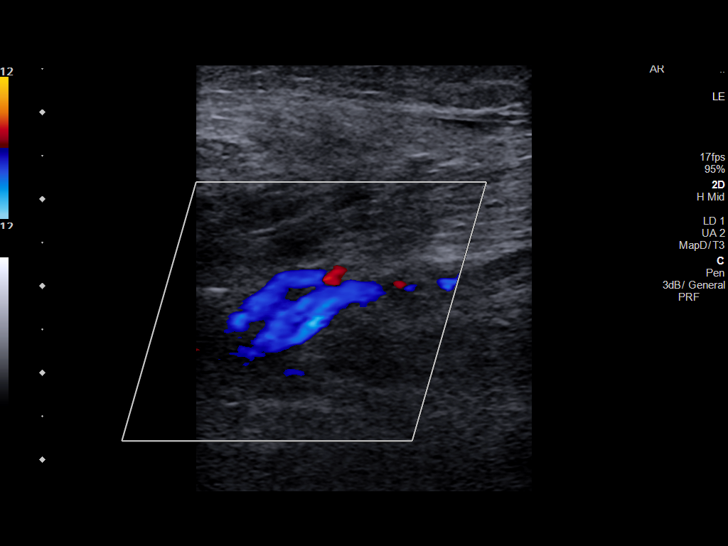
[im 35/35]
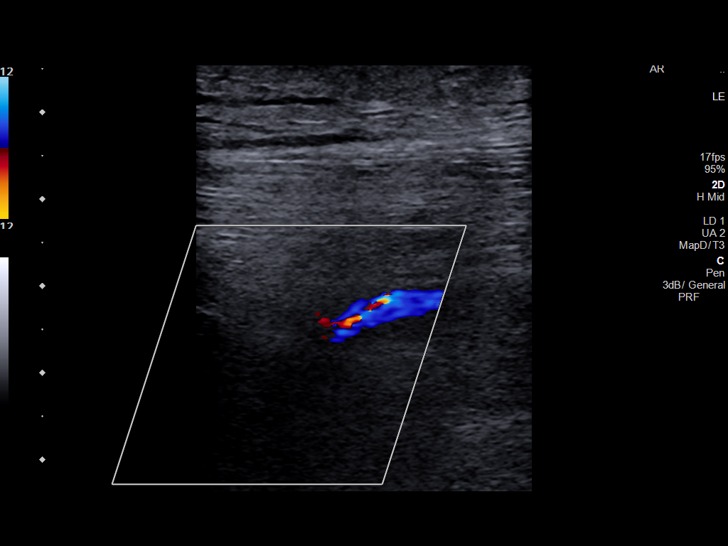

[13 of 24 positions shown; findings below may reference images not displayed]

FINDINGS: Contralateral Common Femoral Vein: Respiratory phasicity is normal
and symmetric with the symptomatic side. No evidence of thrombus.
Normal compressibility.

Common Femoral Vein: No evidence of thrombus. Normal
compressibility, respiratory phasicity and response to augmentation.

Saphenofemoral Junction: No evidence of thrombus. Normal
compressibility and flow on color Doppler imaging.

Profunda Femoral Vein: No evidence of thrombus. Normal
compressibility and flow on color Doppler imaging.

Femoral Vein: No evidence of thrombus. Normal compressibility,
respiratory phasicity and response to augmentation.

Popliteal Vein: No evidence of thrombus. Normal compressibility,
respiratory phasicity and response to augmentation.

Calf Veins: No evidence of thrombus. Normal compressibility and flow
on color Doppler imaging.

Superficial Great Saphenous Vein: No evidence of thrombus. Normal
compressibility.

Venous Reflux:  None.

Other Findings:  None.
IMPRESSION: No evidence of deep venous thrombosis.
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
## 2020-11-26 ENCOUNTER — Other Ambulatory Visit: Payer: Self-pay

## 2020-11-26 ENCOUNTER — Ambulatory Visit: Payer: Medicare Other | Admitting: Urology

## 2020-11-26 ENCOUNTER — Encounter: Payer: Self-pay | Admitting: Urology

## 2020-11-26 VITALS — BP 150/73 | HR 91 | Wt 191.6 lb

## 2020-11-26 DIAGNOSIS — Z8546 Personal history of malignant neoplasm of prostate: Secondary | ICD-10-CM | POA: Diagnosis not present

## 2020-11-26 DIAGNOSIS — C61 Malignant neoplasm of prostate: Secondary | ICD-10-CM

## 2020-11-26 NOTE — Progress Notes (Signed)
Urological Symptom Review  Patient is experiencing the following symptoms: Frequent urination Get up at night to urinate   Review of Systems  Gastrointestinal (upper)  : Negative for upper GI symptoms  Gastrointestinal (lower) : Negative for lower GI symptoms  Constitutional : Negative for symptoms  Skin: Negative for skin symptoms  Eyes: Negative for eye symptoms  Ear/Nose/Throat : Negative for Ear/Nose/Throat symptoms  Hematologic/Lymphatic: Negative for Hematologic/Lymphatic symptoms  Cardiovascular : Negative for cardiovascular symptoms  Respiratory : Cough  Endocrine: Negative for endocrine symptoms  Musculoskeletal: Negative for musculoskeletal symptoms  Neurological: Negative for neurological symptoms  Psychologic: Negative for psychiatric symptoms  

## 2020-11-27 ENCOUNTER — Emergency Department (HOSPITAL_COMMUNITY)
Admission: EM | Admit: 2020-11-27 | Discharge: 2020-11-27 | Disposition: A | Discharge status: Home / Self Care | Payer: Medicare Other | Attending: Emergency Medicine | Admitting: Emergency Medicine

## 2020-11-27 ENCOUNTER — Other Ambulatory Visit: Payer: Self-pay

## 2020-11-27 ENCOUNTER — Encounter (HOSPITAL_COMMUNITY): Payer: Self-pay | Admitting: Emergency Medicine

## 2020-11-27 DIAGNOSIS — Z8546 Personal history of malignant neoplasm of prostate: Secondary | ICD-10-CM | POA: Insufficient documentation

## 2020-11-27 DIAGNOSIS — Z96651 Presence of right artificial knee joint: Secondary | ICD-10-CM | POA: Diagnosis not present

## 2020-11-27 DIAGNOSIS — R0981 Nasal congestion: Secondary | ICD-10-CM | POA: Diagnosis present

## 2020-11-27 DIAGNOSIS — Z87891 Personal history of nicotine dependence: Secondary | ICD-10-CM | POA: Insufficient documentation

## 2020-11-27 DIAGNOSIS — U071 COVID-19: Secondary | ICD-10-CM | POA: Diagnosis not present

## 2020-11-27 DIAGNOSIS — Z79899 Other long term (current) drug therapy: Secondary | ICD-10-CM | POA: Insufficient documentation

## 2020-11-27 DIAGNOSIS — Z28311 Partially vaccinated for covid-19: Secondary | ICD-10-CM | POA: Diagnosis not present

## 2020-11-27 DIAGNOSIS — I10 Essential (primary) hypertension: Secondary | ICD-10-CM | POA: Insufficient documentation

## 2020-11-27 LAB — BASIC METABOLIC PANEL
Anion gap: 8 (ref 5–15)
BUN: 15 mg/dL (ref 8–23)
CO2: 27 mmol/L (ref 22–32)
Calcium: 8.5 mg/dL — ABNORMAL LOW (ref 8.9–10.3)
Chloride: 103 mmol/L (ref 98–111)
Creatinine, Ser: 0.92 mg/dL (ref 0.61–1.24)
GFR, Estimated: >60 mL/min (ref >60)
Glucose, Bld: 93 mg/dL (ref 70–99)
Potassium: 4 mmol/L (ref 3.5–5.1)
Sodium: 138 mmol/L (ref 135–145)

## 2020-11-27 LAB — RESP PANEL BY RT-PCR (FLU A&B, COVID) ARPGX2
Influenza A by PCR: NEGATIVE
Influenza B by PCR: NEGATIVE
SARS Coronavirus 2 by RT PCR: POSITIVE — AB

## 2020-11-27 LAB — PSA: Prostate Specific Ag, Serum: <0.1 ng/mL (ref 0.0–4.0)

## 2020-11-27 MED ORDER — NIRMATRELVIR/RITONAVIR (PAXLOVID)TABLET: 3.0000 | ORAL_TABLET | Freq: Two times a day (BID) | ORAL | 0 refills | Status: DC

## 2020-11-27 NOTE — Discharge Instructions (Addendum)
You have been tested positive for COVID-19 infection.  Our initial plan was to prescribe Paxlovid as treatment for your infection.  Unfortunately you are on several medications that prohibits the use of Paxlovid due to cross reactivity. Therefore, follow instruction below.    Recommendations for at home COVID-19 symptoms management:  Please continue isolation at home. Call [phone] to see whether you might be eligible for therapeutic antibody infusions (leave your name and they will call you back).  If have acute worsening of symptoms please go to ER/urgent care for further evaluation. Check pulse oximetry and if below 90-92% please go to ER. The following supplements MAY help:  Vitamin C 500mg  twice a day and Quercetin 250-500 mg twice a day Vitamin D3 2000 - 4000 u/day B Complex vitamins Zinc 75-100 mg/day Melatonin 6-10 mg at night (the optimal dose is unknown) Aspirin 81mg /day (if no history of bleeding issues)
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
## 2020-11-27 NOTE — ED Triage Notes (Signed)
C/o cough and runny nose for about last 2 days.

## 2020-11-28 ENCOUNTER — Telehealth: Payer: Self-pay

## 2020-11-28 NOTE — Telephone Encounter (Signed)
Melanie at [facility] advised pt was COVID positive yesterday.   Called pt, informed him TCS scheduled for 12/02/20 is being cancelled d/t positive for COVID. Will reschedule when December schedule is available. Endo scheduler informed.

## 2020-11-29 NOTE — Progress Notes (Signed)
Letter printed and mailed.  

## 2020-12-02 ENCOUNTER — Encounter (HOSPITAL_COMMUNITY): Admission: RE | Payer: Self-pay | Source: Home / Self Care

## 2020-12-02 ENCOUNTER — Ambulatory Visit (HOSPITAL_COMMUNITY): Admission: RE | Admit: 2020-12-02 | Payer: Medicare Other | Source: Home / Self Care | Admitting: Internal Medicine

## 2020-12-02 SURGERY — COLONOSCOPY WITH PROPOFOL
Anesthesia: Monitor Anesthesia Care

## 2020-12-02 NOTE — Telephone Encounter (Signed)
Called pt to reschedule. He reports he can't at this time. He is still very sick and didn't know when he can reschedule. Pt to call once he is ready to schedule.

## 2020-12-03 ENCOUNTER — Ambulatory Visit: Payer: PPO | Admitting: Urology

## 2020-12-04 ENCOUNTER — Encounter: Payer: Self-pay | Admitting: Internal Medicine

## 2020-12-04 DIAGNOSIS — M159 Polyosteoarthritis, unspecified: Secondary | ICD-10-CM | POA: Diagnosis not present

## 2020-12-04 DIAGNOSIS — E039 Hypothyroidism, unspecified: Secondary | ICD-10-CM | POA: Diagnosis not present

## 2020-12-04 DIAGNOSIS — U071 COVID-19: Secondary | ICD-10-CM | POA: Diagnosis not present

## 2020-12-12 DIAGNOSIS — R062 Wheezing: Secondary | ICD-10-CM | POA: Diagnosis not present

## 2020-12-12 DIAGNOSIS — R053 Chronic cough: Secondary | ICD-10-CM | POA: Diagnosis not present

## 2020-12-31 ENCOUNTER — Ambulatory Visit: Payer: Medicare Other | Admitting: Internal Medicine

## 2020-12-31 ENCOUNTER — Other Ambulatory Visit: Payer: Self-pay

## 2020-12-31 ENCOUNTER — Encounter: Payer: Self-pay | Admitting: Internal Medicine

## 2020-12-31 ENCOUNTER — Telehealth: Payer: Self-pay

## 2020-12-31 VITALS — BP 144/79 | HR 56 | Temp 97.5°F | Ht 69.0 in | Wt 191.8 lb

## 2020-12-31 DIAGNOSIS — Z1211 Encounter for screening for malignant neoplasm of colon: Secondary | ICD-10-CM

## 2020-12-31 MED ORDER — PEG 3350-KCL-NA BICARB-NACL 420 G PO SOLR: 4000.0000 mL | ORAL | 0 refills | Status: DC
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
## 2020-12-31 NOTE — Patient Instructions (Signed)
It was good seeing you again today!  We will set up a screening colonoscopy-average risk (ASA 2/propofol) at your convenience at [facility].  Further recommendations to follow.
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
## 2020-12-31 NOTE — Telephone Encounter (Signed)
PA for TCS submitted via [facility] website. PA# [id number], valid 01/06/21-01/25/21.  Pt was given TCS instructions at [facility]. Rx for prep sent to pharmacy.

## 2021-01-06 ENCOUNTER — Other Ambulatory Visit: Payer: Self-pay

## 2021-01-06 ENCOUNTER — Encounter (HOSPITAL_COMMUNITY): Payer: Self-pay | Admitting: Internal Medicine

## 2021-01-06 ENCOUNTER — Ambulatory Visit (HOSPITAL_COMMUNITY)
Admission: RE | Admit: 2021-01-06 | Discharge: 2021-01-06 | Disposition: A | Discharge status: Home / Self Care | Payer: Medicare Other | Attending: Internal Medicine | Admitting: Internal Medicine

## 2021-01-06 ENCOUNTER — Ambulatory Visit (HOSPITAL_COMMUNITY): Payer: Medicare Other | Admitting: Anesthesiology

## 2021-01-06 ENCOUNTER — Encounter (HOSPITAL_COMMUNITY)
Admission: RE | Disposition: A | Discharge status: Home / Self Care | Payer: Self-pay | Source: Home / Self Care | Attending: Internal Medicine

## 2021-01-06 DIAGNOSIS — K5730 Diverticulosis of large intestine without perforation or abscess without bleeding: Secondary | ICD-10-CM | POA: Insufficient documentation

## 2021-01-06 DIAGNOSIS — R569 Unspecified convulsions: Secondary | ICD-10-CM | POA: Diagnosis not present

## 2021-01-06 DIAGNOSIS — Z87891 Personal history of nicotine dependence: Secondary | ICD-10-CM | POA: Diagnosis not present

## 2021-01-06 DIAGNOSIS — I10 Essential (primary) hypertension: Secondary | ICD-10-CM | POA: Insufficient documentation

## 2021-01-06 DIAGNOSIS — D122 Benign neoplasm of ascending colon: Secondary | ICD-10-CM | POA: Insufficient documentation

## 2021-01-06 DIAGNOSIS — Z87820 Personal history of traumatic brain injury: Secondary | ICD-10-CM | POA: Insufficient documentation

## 2021-01-06 DIAGNOSIS — Z1211 Encounter for screening for malignant neoplasm of colon: Secondary | ICD-10-CM | POA: Diagnosis not present

## 2021-01-06 DIAGNOSIS — M479 Spondylosis, unspecified: Secondary | ICD-10-CM | POA: Insufficient documentation

## 2021-01-06 DIAGNOSIS — K635 Polyp of colon: Secondary | ICD-10-CM | POA: Diagnosis not present

## 2021-01-06 HISTORY — PX: COLONOSCOPY WITH PROPOFOL: SHX5780

## 2021-01-06 HISTORY — PX: POLYPECTOMY: SHX5525

## 2021-01-06 HISTORY — PX: SUBMUCOSAL TATTOO INJECTION: SHX6856

## 2021-01-06 HISTORY — PX: SCLEROTHERAPY: SHX6841

## 2021-01-06 SURGERY — COLONOSCOPY WITH PROPOFOL
Anesthesia: General

## 2021-01-06 MED ORDER — PROPOFOL 500 MG/50ML IV EMUL
INTRAVENOUS | Status: DC | PRN
  Administered 2021-01-06: 125 ug/kg/min via INTRAVENOUS

## 2021-01-06 MED ORDER — PROPOFOL 10 MG/ML IV BOLUS
INTRAVENOUS | Status: DC | PRN
  Administered 2021-01-06: 100 mg via INTRAVENOUS

## 2021-01-06 MED ORDER — LACTATED RINGERS IV SOLN: INTRAVENOUS | Status: DC

## 2021-01-06 MED ORDER — SPOT INK MARKER SYRINGE KIT
PACK | SUBMUCOSAL | Status: DC | PRN
  Administered 2021-01-06: .5 mL via SUBMUCOSAL

## 2021-01-06 MED ORDER — PHENYLEPHRINE HCL (PRESSORS) 10 MG/ML IV SOLN
INTRAVENOUS | Status: DC | PRN
  Administered 2021-01-06 (×3): 80 ug via INTRAVENOUS

## 2021-01-06 MED ORDER — EPHEDRINE SULFATE 50 MG/ML IJ SOLN
INTRAMUSCULAR | Status: DC | PRN
  Administered 2021-01-06: 5 mg via INTRAVENOUS

## 2021-01-06 NOTE — Interval H&P Note (Signed)
History and Physical Interval Note:  01/06/2021 1:04 PM  Darin Williamson  has presented today for surgery, with the diagnosis of screening colonoscopy.  The various methods of treatment have been discussed with the patient and family. After consideration of risks, benefits and other options for treatment, the patient has consented to  Procedure(s) with comments: COLONOSCOPY WITH PROPOFOL (N/A) - 2:00pm as a surgical intervention.  The patient's history has been reviewed, patient examined, no change in status, stable for surgery.  I have reviewed the patient's chart and labs.  Questions were answered to the patient's satisfaction.     Darin Williamson  No change.  Patient here for screening colonoscopy per plan. The risks, benefits, limitations, alternatives and imponderables have been reviewed with the patient. Questions have been answered. All parties are agreeable.

## 2021-01-06 NOTE — Transfer of Care (Signed)
Immediate Anesthesia Transfer of Care Note  Patient: Darin Williamson  Procedure(s) Performed: COLONOSCOPY WITH PROPOFOL POLYPECTOMY SCLEROTHERAPY SUBMUCOSAL TATTOO INJECTION  Patient Location: Endoscopy Unit  Anesthesia Type:General  Level of Consciousness: awake  Airway & Oxygen Therapy: Patient Spontanous Breathing  Post-op Assessment: Report given to RN and Post -op Vital signs reviewed and stable  Post vital signs: Reviewed and stable  Last Vitals:  Vitals Value Taken Time  BP    Temp    Pulse    Resp    SpO2      Last Pain:  Vitals:   01/06/21 1309  TempSrc:   PainSc: 0-No pain         Complications: No notable events documented.

## 2021-01-06 NOTE — Op Note (Signed)
[facility] Patient Name: Darin Williamson Procedure Date: 01/06/2021 12:55 PM MRN: [id number] Date of Birth: 01-23-1946 Attending MD: Norvel Richards , MD CSN: [id number] Age: 75 Admit Type: Outpatient Procedure:                Colonoscopy Indications:              Screening for colorectal malignant neoplasm Providers:                Norvel Richards, MD, Rosina Lowenstein, RN, Casimer Bilis, Technician Referring MD:              Medicines:                Propofol per Anesthesia Complications:            No immediate complications. Estimated Blood Loss:     Estimated blood loss was minimal. Procedure:                Pre-Anesthesia Assessment:                           - Prior to the procedure, a History and Physical                            was performed, and patient medications and                            allergies were reviewed. The patient's tolerance of                            previous anesthesia was also reviewed. The risks                            and benefits of the procedure and the sedation                            options and risks were discussed with the patient.                            All questions were answered, and informed consent                            was obtained. Prior Anticoagulants: The patient has                            taken no previous anticoagulant or antiplatelet                            agents. ASA Grade Assessment: II - A patient with                            mild systemic disease. After reviewing the risks  and benefits, the patient was deemed in                            satisfactory condition to undergo the procedure.                           After obtaining informed consent, the colonoscope                            was passed under direct vision. Throughout the                            procedure, the patient's blood pressure, pulse, and                             oxygen saturations were monitored continuously. The                            [id number]) scope was introduced through the                            anus and advanced to the the cecum, identified by                            appendiceal orifice and ileocecal valve. The entire                            colon was well visualized. Scope In: 1:15:08 PM Scope Out: 2:29:45 PM Scope Withdrawal Time: 1 hour 11 minutes 14 seconds  Total Procedure Duration: 1 hour 14 minutes 37 seconds  Findings:      Scattered medium-mouthed diverticula were found in the sigmoid colon and       descending colon. Multiple (5) polyps in the ascending segment/hepatic       flexure. 5 to 8 mm semipedunculated. Largest polyp proximately 2 x 2 cm       sessile spiraling to folds just beyond hepatic flexure at an acute       angle. This large polyp was difficult to get out. Multiple other polyps       hot and cold snare removed. Utilizing the adult colonoscope, I lifted       this polyp nicely away from the colonic wall with Festus Holts view. Still it       was a difficult position throughout the procedure; large respiratory       excursions made this lesion a significant challenge, approaching a       significantly moving target. I attempted to retroflex proximally to get       a better look at it. Ascending segment would not accommodate the adult       scope and withdrew the scope and obtained a 3.2 pediatric scope; was       able to get upstream and retroflexed and a good approach/engagement from       this position with a snare and removed this polyp. I was not able to       reproduce this position. The remaining views of the polypectomy site       were on?face.  It appeared that this polyp had completely resected. I       elected to place a mark with ink immediately distal to it within 1 cm. Impression:               - Diverticulosis in the sigmoid colon and in the                            descending  colon.                           Multiple hepatic flexure/ascending colon polyps                            removed as described above. Largest polypectomy                            site demarcated with ink just distal to this site. Moderate Sedation:      Moderate (conscious) sedation was personally administered by an       anesthesia professional. The following parameters were monitored: oxygen       saturation, heart rate, blood pressure, respiratory rate, EKG, adequacy       of pulmonary ventilation, and response to care. Recommendation:           - Patient has a contact number available for                            emergencies. The signs and symptoms of potential                            delayed complications were discussed with the                            patient. Return to normal activities tomorrow.                            Written discharge instructions were provided to the                            patient.                           - Advance diet as tolerated. Follow-up on                            pathology. No aspirin or NSAID agents for the next                            10 days. Anticipate repeat colonoscopy in 6 months                           - Continue present medications. Procedure Code(s):        --- Professional ---                           [phone], Colonoscopy, flexible; diagnostic, including  collection of specimen(s) by brushing or washing,                            when performed (separate procedure) Diagnosis Code(s):        --- Professional ---                           Z12.11, Encounter for screening for malignant                            neoplasm of colon                           K57.30, Diverticulosis of large intestine without                            perforation or abscess without bleeding CPT copyright 2019 American Medical Association. All rights reserved. The codes documented in this report are preliminary  and upon coder review may  be revised to meet current compliance requirements. Cristopher Estimable. Crandall Harvel, MD Norvel Richards, MD 01/06/2021 2:52:05 PM This report has been signed electronically. Number of Addenda: 0
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
## 2021-01-06 NOTE — Discharge Instructions (Signed)
  Colonoscopy Discharge Instructions  Read the instructions outlined below and refer to this sheet in the next few weeks. These discharge instructions provide you with general information on caring for yourself after you leave the hospital. Your doctor may also give you specific instructions. While your treatment has been planned according to the most current medical practices available, unavoidable complications occasionally occur. If you have any problems or questions after discharge, call Dr. Gala Romney at [phone]. ACTIVITY You may resume your regular activity, but move at a slower pace for the next 24 hours.  Take frequent rest periods for the next 24 hours.  Walking will help get rid of the air and reduce the bloated feeling in your belly (abdomen).  No driving for 24 hours (because of the medicine (anesthesia) used during the test).   Do not sign any important legal documents or operate any machinery for 24 hours (because of the anesthesia used during the test).  NUTRITION Drink plenty of fluids.  You may resume your normal diet as instructed by your doctor.  Begin with a light meal and progress to your normal diet. Heavy or fried foods are harder to digest and may make you feel sick to your stomach (nauseated).  Avoid alcoholic beverages for 24 hours or as instructed.  MEDICATIONS You may resume your normal medications unless your doctor tells you otherwise.  WHAT YOU CAN EXPECT TODAY Some feelings of bloating in the abdomen.  Passage of more gas than usual.  Spotting of blood in your stool or on the toilet paper.  IF YOU HAD POLYPS REMOVED DURING THE COLONOSCOPY: No aspirin products for 7 days or as instructed.  No alcohol for 7 days or as instructed.  Eat a soft diet for the next 24 hours.  FINDING OUT THE RESULTS OF YOUR TEST Not all test results are available during your visit. If your test results are not back during the visit, make an appointment with your caregiver to find out the  results. Do not assume everything is normal if you have not heard from your caregiver or the medical facility. It is important for you to follow up on all of your test results.  SEEK IMMEDIATE MEDICAL ATTENTION IF: You have more than a spotting of blood in your stool.  Your belly is swollen (abdominal distention).  You are nauseated or vomiting.  You have a temperature over 101.  You have abdominal pain or discomfort that is severe or gets worse throughout the day.     You had multiple polyps in your colon today.  1 large one on the right side of your colon required special maneuvers to remove.  Do not take any aspirin or Advil or other NSAIDs for the next 10 days  Further recommendations to follow pending review of pathology report  Assuming pathology is okay, you will need to have a repeat colonoscopy in 6 months to make sure that all polyp tissue was removed  At patient request, I called Cheryle Horsfall at [phone] and reviewed findings/recommendations

## 2021-01-06 NOTE — Anesthesia Postprocedure Evaluation (Signed)
Anesthesia Post Note  Patient: Darin Williamson  Procedure(s) Performed: COLONOSCOPY WITH PROPOFOL POLYPECTOMY SCLEROTHERAPY SUBMUCOSAL TATTOO INJECTION  Patient location during evaluation: Endoscopy Anesthesia Type: General Level of consciousness: awake and alert and oriented Pain management: pain level controlled Vital Signs Assessment: post-procedure vital signs reviewed and stable Respiratory status: spontaneous breathing, nonlabored ventilation and respiratory function stable Cardiovascular status: blood pressure returned to baseline and stable Postop Assessment: no apparent nausea or vomiting Anesthetic complications: no   No notable events documented.   Last Vitals:  Vitals:   01/06/21 1238 01/06/21 1434  BP: (!) 141/85 119/60  Pulse: 61 93  Resp: 20 19  Temp: 36.6 C   SpO2: 95%     Last Pain:  Vitals:   01/06/21 1434  TempSrc: Oral  PainSc:                  [name] C Charmon Thorson

## 2021-01-08 ENCOUNTER — Encounter: Payer: Self-pay | Admitting: Internal Medicine

## 2021-01-08 LAB — SURGICAL PATHOLOGY

## 2021-01-09 ENCOUNTER — Encounter (HOSPITAL_COMMUNITY): Payer: Self-pay | Admitting: Internal Medicine

## 2021-03-24 IMAGING — MR MR CERVICAL SPINE W/O CM
5 series · 36 of 48 positions shown · non-contrast
Comparison: None.

CLINICAL DATA: Right arm weakness

EXAM:
MRI CERVICAL SPINE WITHOUT CONTRAST
TECHNIQUE: Multiplanar, multisequence MR imaging of the cervical spine was
performed. No intravenous contrast was administered.

[Series 2: T2 · sagittal · 3.0mm · 0.41mm/px · 8 of 18 slices shown (1 of 2)]
[im 1/18]
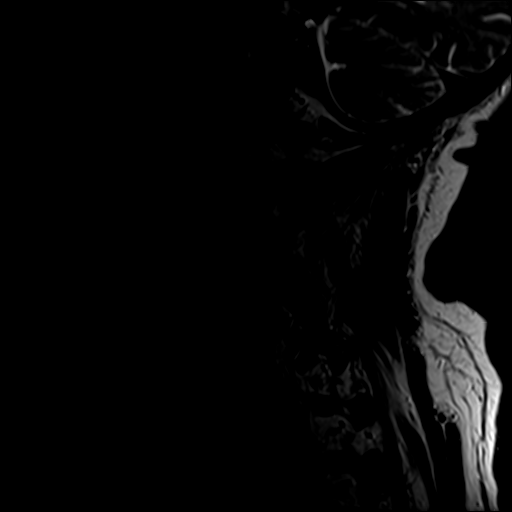
[im 3/18]
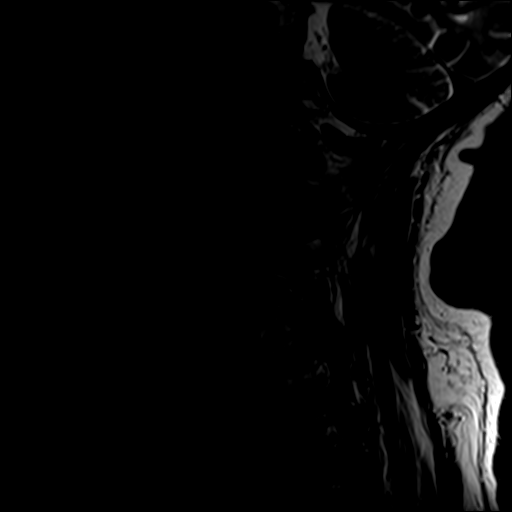
[im 5/18]
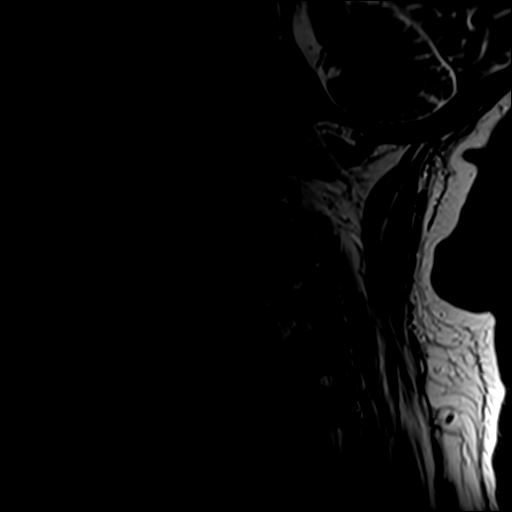
[im 8/18]
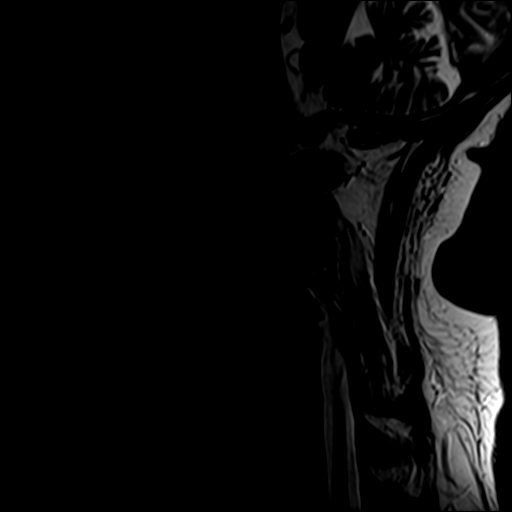
[im 10/18]
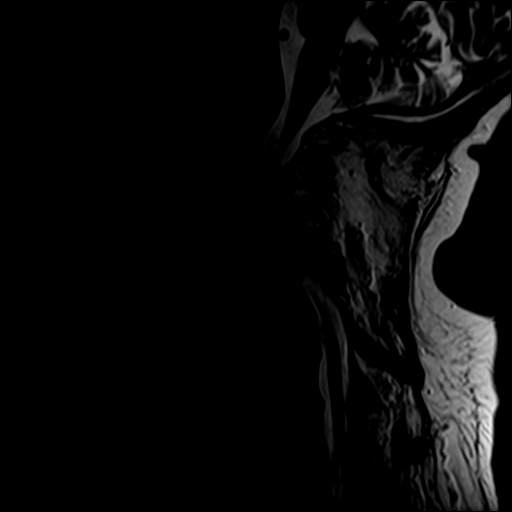
[im 13/18]
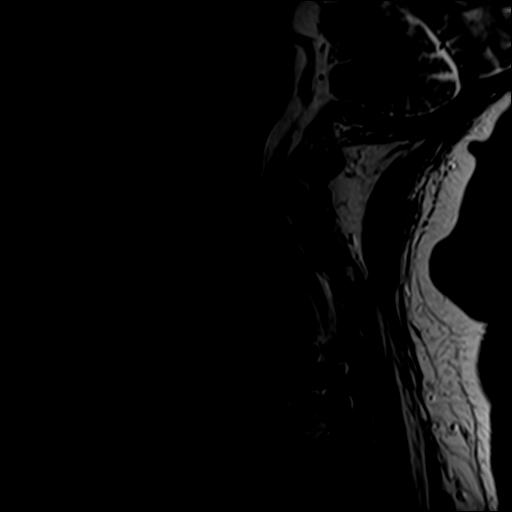
[im 15/18]
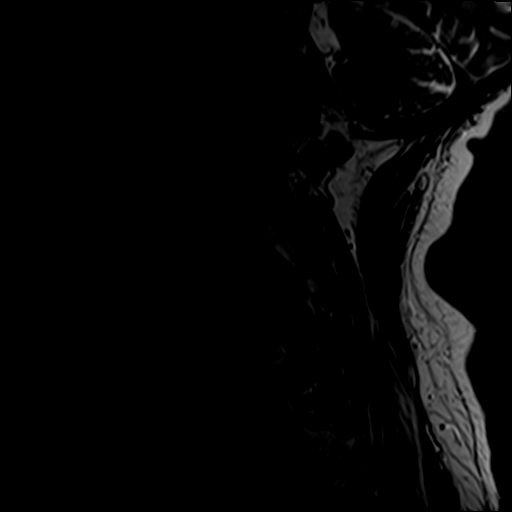
[im 18/18]
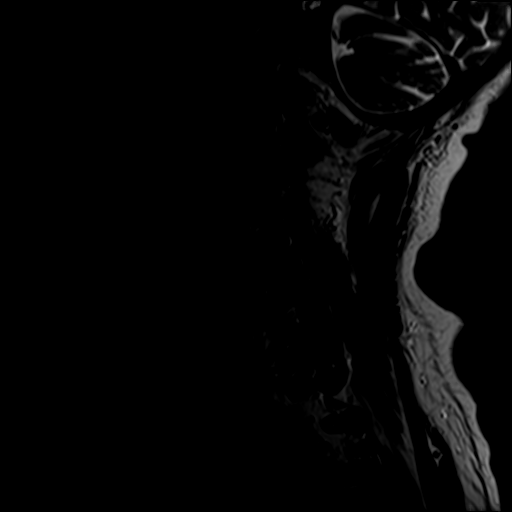

[Series 3: STIR · sagittal · 3.0mm · 0.82mm/px · 8 of 17 slices shown]
[im 1/17]
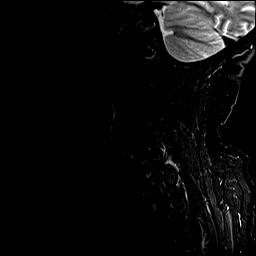
[im 3/17]
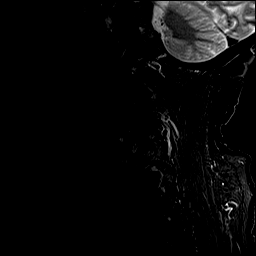
[im 5/17]
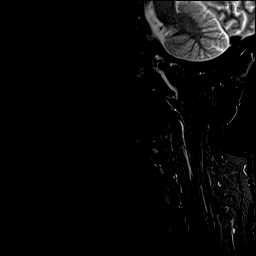
[im 7/17]
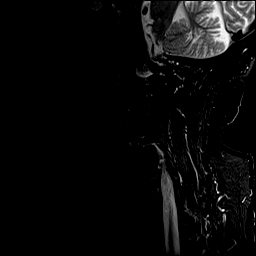
[im 10/17]
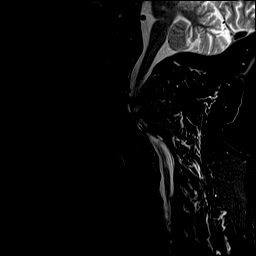
[im 12/17]
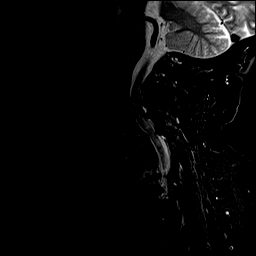
[im 14/17]
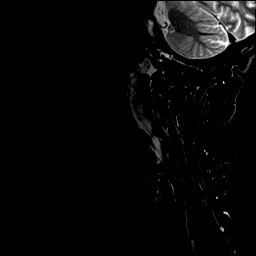
[im 17/17]
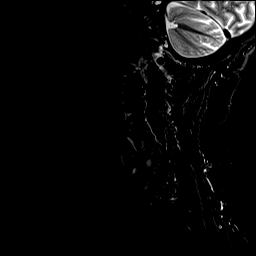

[Series 4: T1 · sagittal · 3.0mm · 0.82mm/px · 8 of 17 slices shown]
[im 1/17]
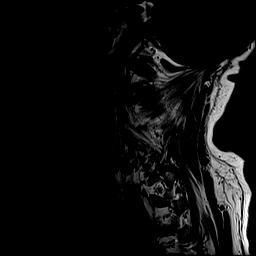
[im 3/17]
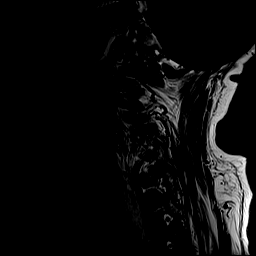
[im 5/17]
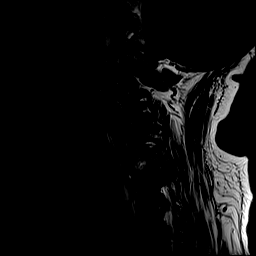
[im 7/17]
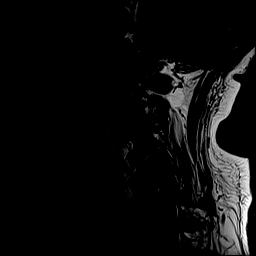
[im 10/17]
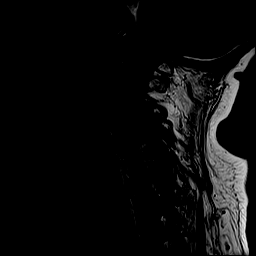
[im 12/17]
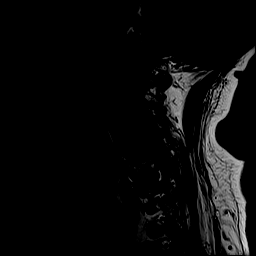
[im 14/17]
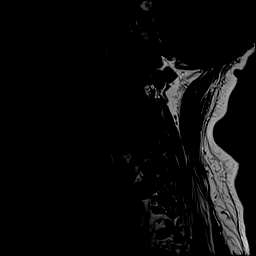
[im 17/17]
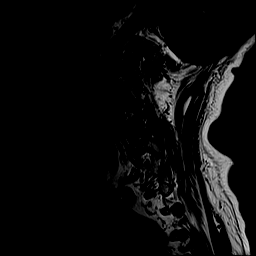

[Series 5: T2 · axial · 3.0mm · 0.70mm/px · z∈[-39,+55]mm · 9 of 27 slices shown (2 of 2)]
[im 1/27]
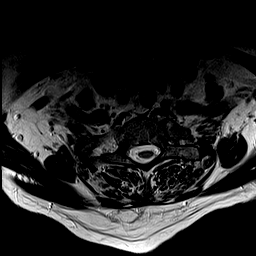
[im 5/27]
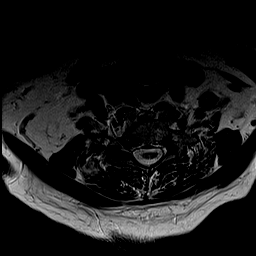
[im 8/27]
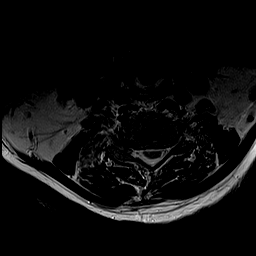
[im 12/27]
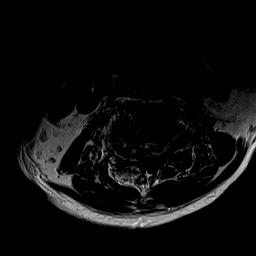
[im 15/27]
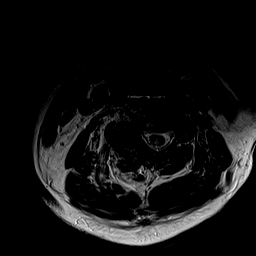
[im 19/27]
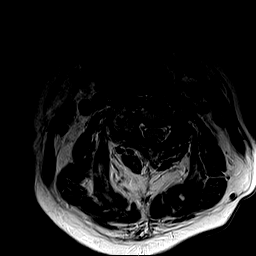
[im 22/27]
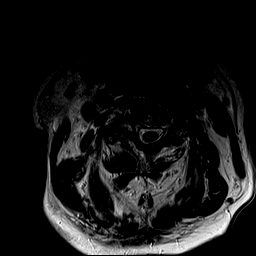
[im 24/27]
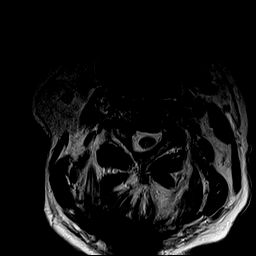
[im 27/27]
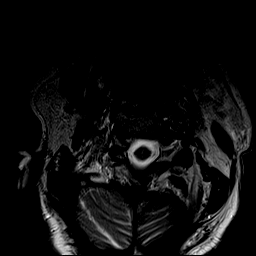

[Series 6: GRE · axial · 3.0mm · 0.35mm/px · z∈[-37,-16]mm · 3 of 26 slices shown]
[im 1/26]
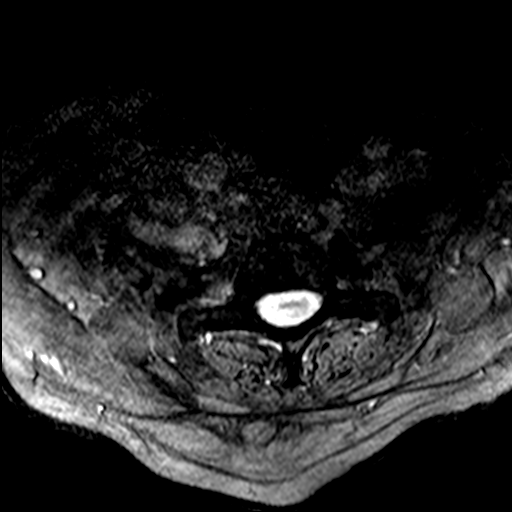
[im 5/26]
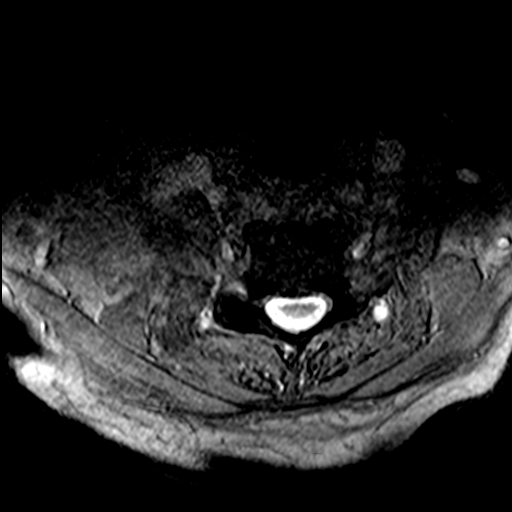
[im 7/26]
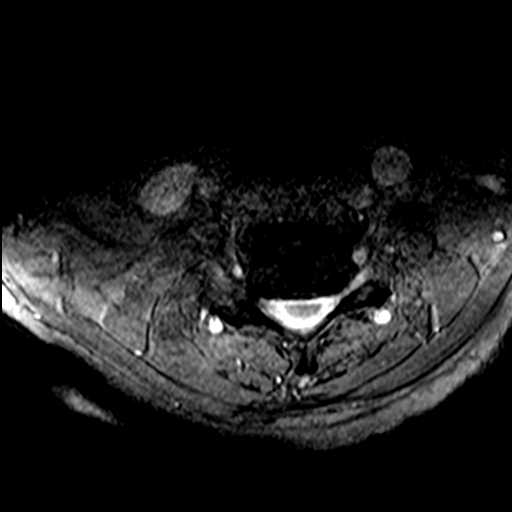

[36 of 48 positions shown; findings below may reference images not displayed]

FINDINGS: Motion artifact is present.

Alignment: Levocurvature.  Trace anterolisthesis at C5-C6.

Vertebrae: There is degenerative endplate irregularity. Partial
fusion across the C6-C7 disc space. No substantial marrow edema. No
suspicious osseous lesion.

Cord: Suspected cord T2 hyperintensity at C6.

Posterior Fossa, vertebral arteries, paraspinal tissues:
Unremarkable.

Disc levels:

C2-C3: Disc bulge, endplate osteophytes, and facet and uncovertebral
hypertrophy. Right facet degeneration is marked. No significant
canal stenosis. Marked right and mild left foraminal stenosis.

C3-C4: Disc bulge, endplate osteophytes, ligamentum flavum
thickening, and facet and uncovertebral hypertrophy. Right facet
degeneration is marked. Marked canal stenosis. Moderate to marked
foraminal stenosis.

C4-C5: Disc bulge, endplate osteophytes, and facet and uncovertebral
hypertrophy. Right facet degeneration is marked. Mild canal
stenosis. Marked right foraminal stenosis. Mild left foraminal
stenosis.

C5-C6: Disc bulge, endplate osteophytes, ligamentum flavum
thickening, and facet and uncovertebral hypertrophy. Right facet
degeneration is marked. Moderate canal stenosis. Moderate to marked
right and moderate left foraminal stenosis.

C6-C7: Disc bulge and endplate osteophytes. No canal stenosis. Minor
right and mild left foraminal stenosis.

C7-T1: Disc bulge, endplate osteophytes, and facet hypertrophy. No
canal stenosis. Mild foraminal stenosis.
IMPRESSION: Multilevel degenerative changes with suboptimal stenosis evaluation
due to motion artifact. Canal stenosis is greatest at C3-C4. There
is multilevel significant right facet degeneration. Multilevel
marked right foraminal stenosis.

Suspected abnormal cord signal at the C6 level reflecting
myelomalacia or edema. However, there is no significant canal
narrowing at this level.

## 2021-03-31 ENCOUNTER — Telehealth: Payer: Self-pay | Admitting: Internal Medicine

## 2021-03-31 NOTE — Telephone Encounter (Signed)
Patient called and said that he is supposed to have a repeat tcs in 6 months, he contacted his [employer] and they stated they would not pay for it.  He would like to knowhow much this will cost him  ?

## 2021-04-01 NOTE — Telephone Encounter (Signed)
Spoke to [place] and explained how to do estimate ?

## 2021-05-19 DIAGNOSIS — L820 Inflamed seborrheic keratosis: Secondary | ICD-10-CM | POA: Diagnosis not present

## 2021-05-19 DIAGNOSIS — B078 Other viral warts: Secondary | ICD-10-CM | POA: Diagnosis not present

## 2021-05-22 DIAGNOSIS — E559 Vitamin D deficiency, unspecified: Secondary | ICD-10-CM | POA: Diagnosis not present

## 2021-05-22 DIAGNOSIS — E039 Hypothyroidism, unspecified: Secondary | ICD-10-CM | POA: Diagnosis not present

## 2021-05-22 DIAGNOSIS — I10 Essential (primary) hypertension: Secondary | ICD-10-CM | POA: Diagnosis not present

## 2021-05-22 DIAGNOSIS — M159 Polyosteoarthritis, unspecified: Secondary | ICD-10-CM | POA: Diagnosis not present

## 2021-05-22 DIAGNOSIS — Z0001 Encounter for general adult medical examination with abnormal findings: Secondary | ICD-10-CM | POA: Diagnosis not present

## 2021-06-02 ENCOUNTER — Encounter: Payer: Self-pay | Admitting: *Deleted

## 2021-06-11 ENCOUNTER — Encounter: Payer: Self-pay | Admitting: *Deleted

## 2021-07-07 ENCOUNTER — Telehealth: Payer: Self-pay | Admitting: *Deleted

## 2021-07-07 NOTE — Telephone Encounter (Signed)
Gastroenterology Pre-Procedure Review  Request Date: 07/07/21 Requesting Physician: Dr. Gala Romney  Last Colonoscopy 12/2020, 6 month recall, extra 15 minutes per Dr. Gala Romney  PATIENT REVIEW QUESTIONS: The patient responded to the following health history questions as indicated:    1. Diabetes Melitis: no 2. Joint replacements in the past 12 months: no 3. Major health problems in the past 3 months: N/A 4. Has an artificial valve or MVP: no 5. Has a defibrillator: no 6. Has been advised in past to take antibiotics in advance of a procedure like teeth cleaning: n/a 7. Family history of colon cancer: yes (Maternal/paternal grandmothers)  92. Alcohol Use: no 9. Illicit drug Use: no 10. History of sleep apnea: N/A  11. History of coronary artery or other vascular stents placed within the last 12 months: N/A 12. History of any prior anesthesia complications: N/A 13. There is no height or weight on file to calculate BMI.    MEDICATIONS & ALLERGIES:    Patient reports the following regarding taking any blood thinners:   Plavix? no Aspirin? no Coumadin? no Brilinta? no Xarelto? no Eliquis? no Pradaxa? no Savaysa? no Effient? no  Patient confirms/reports the following medications:  Current Outpatient Medications  Medication Sig Dispense Refill   amLODipine (NORVASC) 10 MG tablet Take 10 mg by mouth daily.     losartan (COZAAR) 100 MG tablet Take 100 mg by mouth daily.     montelukast (SINGULAIR) 10 MG tablet Take 10 mg by mouth daily.     oxybutynin (DITROPAN-XL) 5 MG 24 hr tablet Take 1 tablet (5 mg total) by mouth at bedtime. (Patient not taking: Reported on 01/02/2021) 90 tablet 3   phenytoin (DILANTIN) 100 MG ER capsule TAKE TWO CAPSULES IN THE MORNING AND 1 IN THE EVENING. (Patient taking differently: Take 100-200 mg by mouth See admin instructions. Take 200 mg by mouth in the morning and 100 mg in the evening) 90 capsule 0   polyethylene glycol-electrolytes (NULYTELY) 420 g solution As  directed (Patient not taking: Reported on 12/31/2020) 4000 mL 0   polyethylene glycol-electrolytes (TRILYTE) 420 g solution Take 4,000 mLs by mouth as directed. 4000 mL 0   No current facility-administered medications for this visit.    Patient confirms/reports the following allergies:  Allergies  Allergen Reactions   Prednisone Hives    No orders of the defined types were placed in this encounter.   This Gastroenterology Pre-Precedure Review Form is being routed to the following provider(s): Laban Emperor, NP

## 2021-07-07 NOTE — Telephone Encounter (Signed)
ASA 2. Propofol. Last colonoscopy with piecemeal removal. 6 month surveillance needed. Appropriate.

## 2021-07-08 NOTE — Telephone Encounter (Signed)
PA approved via [facility]. Auth# [id number], DOS: Jul 18, 2021 - Oct 16, 2021

## 2021-07-08 NOTE — Telephone Encounter (Signed)
Spoke with pt. He has been scheduled for 6/23 at 9:30am. He will come by office this week to pick up instructions/prep sample.

## 2021-07-18 ENCOUNTER — Encounter (HOSPITAL_COMMUNITY)
Admission: RE | Disposition: A | Discharge status: Home / Self Care | Payer: Self-pay | Source: Home / Self Care | Attending: Internal Medicine

## 2021-07-18 ENCOUNTER — Ambulatory Visit (HOSPITAL_COMMUNITY): Payer: Medicare Other | Admitting: Anesthesiology

## 2021-07-18 ENCOUNTER — Other Ambulatory Visit: Payer: Self-pay

## 2021-07-18 ENCOUNTER — Ambulatory Visit (HOSPITAL_BASED_OUTPATIENT_CLINIC_OR_DEPARTMENT_OTHER): Payer: Medicare Other | Admitting: Anesthesiology

## 2021-07-18 ENCOUNTER — Encounter (HOSPITAL_COMMUNITY): Payer: Self-pay | Admitting: Internal Medicine

## 2021-07-18 ENCOUNTER — Ambulatory Visit (HOSPITAL_COMMUNITY)
Admission: RE | Admit: 2021-07-18 | Discharge: 2021-07-18 | Disposition: A | Discharge status: Home / Self Care | Payer: Medicare Other | Attending: Internal Medicine | Admitting: Internal Medicine

## 2021-07-18 DIAGNOSIS — Z86010 Personal history of colonic polyps: Secondary | ICD-10-CM

## 2021-07-18 DIAGNOSIS — Z87891 Personal history of nicotine dependence: Secondary | ICD-10-CM | POA: Insufficient documentation

## 2021-07-18 DIAGNOSIS — G40909 Epilepsy, unspecified, not intractable, without status epilepticus: Secondary | ICD-10-CM

## 2021-07-18 DIAGNOSIS — K5730 Diverticulosis of large intestine without perforation or abscess without bleeding: Secondary | ICD-10-CM | POA: Diagnosis not present

## 2021-07-18 DIAGNOSIS — K6389 Other specified diseases of intestine: Secondary | ICD-10-CM | POA: Diagnosis not present

## 2021-07-18 DIAGNOSIS — R29898 Other symptoms and signs involving the musculoskeletal system: Secondary | ICD-10-CM

## 2021-07-18 DIAGNOSIS — C61 Malignant neoplasm of prostate: Secondary | ICD-10-CM

## 2021-07-18 DIAGNOSIS — Z96651 Presence of right artificial knee joint: Secondary | ICD-10-CM

## 2021-07-18 DIAGNOSIS — D122 Benign neoplasm of ascending colon: Secondary | ICD-10-CM

## 2021-07-18 DIAGNOSIS — M1711 Unilateral primary osteoarthritis, right knee: Secondary | ICD-10-CM

## 2021-07-18 DIAGNOSIS — Z09 Encounter for follow-up examination after completed treatment for conditions other than malignant neoplasm: Secondary | ICD-10-CM | POA: Diagnosis not present

## 2021-07-18 DIAGNOSIS — K635 Polyp of colon: Secondary | ICD-10-CM | POA: Insufficient documentation

## 2021-07-18 DIAGNOSIS — Z79899 Other long term (current) drug therapy: Secondary | ICD-10-CM | POA: Insufficient documentation

## 2021-07-18 DIAGNOSIS — Z1211 Encounter for screening for malignant neoplasm of colon: Secondary | ICD-10-CM | POA: Diagnosis not present

## 2021-07-18 DIAGNOSIS — M25511 Pain in right shoulder: Secondary | ICD-10-CM

## 2021-07-18 DIAGNOSIS — R569 Unspecified convulsions: Secondary | ICD-10-CM | POA: Diagnosis not present

## 2021-07-18 DIAGNOSIS — K648 Other hemorrhoids: Secondary | ICD-10-CM | POA: Diagnosis not present

## 2021-07-18 DIAGNOSIS — I10 Essential (primary) hypertension: Secondary | ICD-10-CM | POA: Diagnosis not present

## 2021-07-18 HISTORY — PX: BIOPSY: SHX5522

## 2021-07-18 HISTORY — PX: COLONOSCOPY WITH PROPOFOL: SHX5780

## 2021-07-18 HISTORY — PX: POLYPECTOMY: SHX5525

## 2021-07-18 HISTORY — PX: SUBMUCOSAL LIFTING INJECTION: SHX6855

## 2021-07-18 HISTORY — PX: HOT HEMOSTASIS: SHX5433

## 2021-07-18 SURGERY — COLONOSCOPY WITH PROPOFOL
Anesthesia: General

## 2021-07-18 MED ORDER — EPHEDRINE SULFATE-NACL 50-0.9 MG/10ML-% IV SOSY
PREFILLED_SYRINGE | INTRAVENOUS | Status: DC | PRN
  Administered 2021-07-18: 10 mg via INTRAVENOUS

## 2021-07-18 MED ORDER — PROPOFOL 10 MG/ML IV BOLUS
INTRAVENOUS | Status: DC | PRN
  Administered 2021-07-18: 80 mg via INTRAVENOUS

## 2021-07-18 MED ORDER — LACTATED RINGERS IV SOLN: INTRAVENOUS | Status: DC

## 2021-07-18 MED ORDER — LIDOCAINE 2% (20 MG/ML) 5 ML SYRINGE
INTRAMUSCULAR | Status: DC | PRN
  Administered 2021-07-18: 50 mg via INTRAVENOUS

## 2021-07-18 MED ORDER — PROPOFOL 500 MG/50ML IV EMUL
INTRAVENOUS | Status: DC | PRN
  Administered 2021-07-18: 100 ug/kg/min via INTRAVENOUS

## 2021-07-18 NOTE — Anesthesia Postprocedure Evaluation (Signed)
Anesthesia Post Note  Patient: Darin Williamson  Procedure(s) Performed: COLONOSCOPY WITH PROPOFOL POLYPECTOMY SUBMUCOSAL LIFTING INJECTION HOT HEMOSTASIS (ARGON PLASMA COAGULATION/BICAP) BIOPSY  Patient location during evaluation: Phase II Anesthesia Type: General Level of consciousness: awake and alert and oriented Pain management: pain level controlled Vital Signs Assessment: post-procedure vital signs reviewed and stable Respiratory status: spontaneous breathing, nonlabored ventilation and respiratory function stable Cardiovascular status: blood pressure returned to baseline and stable Postop Assessment: no apparent nausea or vomiting Anesthetic complications: no   No notable events documented.   Last Vitals:  Vitals:   07/18/21 0811 07/18/21 1048  BP: (!) 153/81 (!) 107/57  Pulse:  72  Resp: (!) 21 18  Temp: 36.6 C 36.5 C  SpO2: 96% 95%    Last Pain:  Vitals:   07/18/21 1048  TempSrc: Oral  PainSc: 0-No pain                 Aleisa Howk C Melika Reder

## 2021-07-18 NOTE — Transfer of Care (Signed)
Immediate Anesthesia Transfer of Care Note  Patient: BRANDIS MCGAHEE  Procedure(s) Performed: COLONOSCOPY WITH PROPOFOL POLYPECTOMY SUBMUCOSAL LIFTING INJECTION HOT HEMOSTASIS (ARGON PLASMA COAGULATION/BICAP) BIOPSY  Patient Location: Endoscopy Unit  Anesthesia Type:MAC  Level of Consciousness: awake, alert , oriented and patient cooperative  Airway & Oxygen Therapy: Patient Spontanous Breathing and Patient connected to nasal cannula oxygen  Post-op Assessment: Report given to RN, Post -op Vital signs reviewed and stable and Patient moving all extremities  Post vital signs: Reviewed and stable  Last Vitals:  Vitals Value Taken Time  BP 107/57 07/18/21 1048  Temp 36.5 C 07/18/21 1048  Pulse 72 07/18/21 1048  Resp 18 07/18/21 1048  SpO2 95 % 07/18/21 1048    Last Pain:  Vitals:   07/18/21 1048  TempSrc: Oral  PainSc: 0-No pain      Patients Stated Pain Goal: 8 (07/18/21 0811)  Complications: No notable events documented.

## 2021-07-22 LAB — SURGICAL PATHOLOGY

## 2021-07-24 ENCOUNTER — Encounter (HOSPITAL_COMMUNITY): Payer: Self-pay | Admitting: Internal Medicine

## 2021-08-04 ENCOUNTER — Other Ambulatory Visit: Payer: Self-pay | Admitting: Urology

## 2021-08-04 DIAGNOSIS — C61 Malignant neoplasm of prostate: Secondary | ICD-10-CM

## 2021-08-06 ENCOUNTER — Encounter: Payer: Self-pay | Admitting: Internal Medicine

## 2021-09-30 DIAGNOSIS — E782 Mixed hyperlipidemia: Secondary | ICD-10-CM | POA: Diagnosis not present

## 2021-09-30 DIAGNOSIS — E039 Hypothyroidism, unspecified: Secondary | ICD-10-CM | POA: Diagnosis not present

## 2021-09-30 DIAGNOSIS — I10 Essential (primary) hypertension: Secondary | ICD-10-CM | POA: Diagnosis not present

## 2021-09-30 DIAGNOSIS — Z8673 Personal history of transient ischemic attack (TIA), and cerebral infarction without residual deficits: Secondary | ICD-10-CM | POA: Diagnosis not present

## 2021-09-30 DIAGNOSIS — M159 Polyosteoarthritis, unspecified: Secondary | ICD-10-CM | POA: Diagnosis not present

## 2021-09-30 DIAGNOSIS — M1991 Primary osteoarthritis, unspecified site: Secondary | ICD-10-CM | POA: Diagnosis not present

## 2021-10-30 DIAGNOSIS — H40013 Open angle with borderline findings, low risk, bilateral: Secondary | ICD-10-CM | POA: Diagnosis not present

## 2021-10-30 DIAGNOSIS — H25811 Combined forms of age-related cataract, right eye: Secondary | ICD-10-CM | POA: Diagnosis not present

## 2021-10-30 DIAGNOSIS — Z961 Presence of intraocular lens: Secondary | ICD-10-CM | POA: Diagnosis not present

## 2021-11-27 DIAGNOSIS — Z23 Encounter for immunization: Secondary | ICD-10-CM | POA: Diagnosis not present
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
## 2021-12-02 ENCOUNTER — Ambulatory Visit: Payer: Medicare Other | Admitting: Urology

## 2021-12-02 DIAGNOSIS — Z8546 Personal history of malignant neoplasm of prostate: Secondary | ICD-10-CM

## 2021-12-23 ENCOUNTER — Ambulatory Visit (INDEPENDENT_AMBULATORY_CARE_PROVIDER_SITE_OTHER): Payer: Medicare Other | Admitting: Urology

## 2021-12-23 ENCOUNTER — Encounter: Payer: Self-pay | Admitting: Urology

## 2021-12-23 VITALS — BP 159/89 | HR 64

## 2021-12-23 DIAGNOSIS — Z8546 Personal history of malignant neoplasm of prostate: Secondary | ICD-10-CM | POA: Diagnosis not present
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
## 2021-12-24 LAB — PSA: Prostate Specific Ag, Serum: <0.1 ng/mL (ref 0.0–4.0)

## 2021-12-25 LAB — URINALYSIS, ROUTINE W REFLEX MICROSCOPIC
Bilirubin, UA: NEGATIVE
Glucose, UA: NEGATIVE
Ketones, UA: NEGATIVE
Leukocytes,UA: NEGATIVE
Nitrite, UA: NEGATIVE
Protein,UA: NEGATIVE
Specific Gravity, UA: 1.03 (ref 1.005–1.030)
Urobilinogen, Ur: 0.2 mg/dL (ref 0.2–1.0)
pH, UA: 5.5 (ref 5.0–7.5)

## 2021-12-25 LAB — MICROSCOPIC EXAMINATION: Bacteria, UA: NONE SEEN

## 2022-01-23 DIAGNOSIS — J069 Acute upper respiratory infection, unspecified: Secondary | ICD-10-CM | POA: Diagnosis not present

## 2022-01-25 ENCOUNTER — Emergency Department (HOSPITAL_COMMUNITY)
Admission: EM | Admit: 2022-01-25 | Discharge: 2022-01-25 | Disposition: A | Discharge status: Home / Self Care | Payer: Medicare Other | Attending: Emergency Medicine | Admitting: Emergency Medicine

## 2022-01-25 ENCOUNTER — Encounter (HOSPITAL_COMMUNITY): Payer: Self-pay

## 2022-01-25 ENCOUNTER — Other Ambulatory Visit: Payer: Self-pay

## 2022-01-25 ENCOUNTER — Emergency Department (HOSPITAL_COMMUNITY): Payer: Medicare Other

## 2022-01-25 DIAGNOSIS — Z1152 Encounter for screening for COVID-19: Secondary | ICD-10-CM | POA: Diagnosis not present

## 2022-01-25 DIAGNOSIS — J189 Pneumonia, unspecified organism: Secondary | ICD-10-CM | POA: Insufficient documentation

## 2022-01-25 DIAGNOSIS — R059 Cough, unspecified: Secondary | ICD-10-CM | POA: Diagnosis not present

## 2022-01-25 LAB — RESP PANEL BY RT-PCR (RSV, FLU A&B, COVID)  RVPGX2
Influenza A by PCR: NEGATIVE
Influenza B by PCR: NEGATIVE
Resp Syncytial Virus by PCR: NEGATIVE
SARS Coronavirus 2 by RT PCR: NEGATIVE

## 2022-01-25 MED ORDER — BENZONATATE 100 MG PO CAPS: 100.0000 mg | ORAL_CAPSULE | Freq: Four times a day (QID) | ORAL | 0 refills | Status: AC | PRN

## 2022-01-25 MED ORDER — LEVOFLOXACIN 500 MG PO TABS: 500.0000 mg | ORAL_TABLET | Freq: Every day | ORAL | 0 refills | Status: AC

## 2022-01-25 NOTE — ED Provider Notes (Signed)
[facility] EMERGENCY DEPARTMENT Provider Note   CSN: [id number] Arrival date & time: 01/25/22  1224     History  Chief Complaint  Patient presents with   Cough    Darin Williamson is a 76 y.o. male.  The history is provided by the patient. No language interpreter was used.  Cough Cough characteristics:  Productive Sputum characteristics:  Nondescript Severity:  Moderate Onset quality:  Gradual Duration:  4 days Timing:  Constant Progression:  Worsening Chronicity:  New Smoker: no   Context: sick contacts   Relieved by:  Nothing Worsened by:  Nothing Ineffective treatments:  None tried Associated symptoms: no wheezing   Risk factors: no recent infection        Home Medications Prior to Admission medications   Medication Sig Start Date End Date Taking? Authorizing Provider  amLODipine (NORVASC) 10 MG tablet Take 10 mg by mouth daily. 07/11/19   [provider]  losartan (COZAAR) 100 MG tablet Take 100 mg by mouth daily. 07/12/20   [provider]  montelukast (SINGULAIR) 10 MG tablet Take 10 mg by mouth daily as needed (allergies). 11/30/19   [provider]  oxybutynin (DITROPAN-XL) 5 MG 24 hr tablet Take 1 tablet (5 mg total) by mouth at bedtime as needed (overactive bladder). 08/04/21   Franchot Gallo, MD  phenytoin (DILANTIN) 100 MG ER capsule TAKE TWO CAPSULES IN THE MORNING AND 1 IN THE EVENING. 05/03/19   Gerlene Fee, NP  polyethylene glycol-electrolytes (TRILYTE) 420 g solution Take 4,000 mLs by mouth as directed. 12/31/20   Rourk, Cristopher Estimable, MD  Vitamin D, Ergocalciferol, (DRISDOL) 1.25 MG (50000 UNIT) CAPS capsule Take 50,000 Units by mouth every Thursday. 06/02/21   [provider]      Allergies    Prednisone    Review of Systems   Review of Systems  Respiratory:  Positive for cough. Negative for wheezing.   All other systems reviewed and are negative.   Physical Exam Updated Vital Signs BP (!) 158/91 (BP  Location: Right Arm)   Pulse 99   Temp 99.9 F (37.7 C) (Oral)   Resp 17   Ht '5\' 9"'$  (1.753 m)   Wt 93 kg   SpO2 93%   BMI 30.27 kg/m  Physical Exam Vitals and nursing note reviewed.  Constitutional:      Appearance: He is well-developed.  HENT:     Head: Normocephalic.     Right Ear: Tympanic membrane normal.     Left Ear: Tympanic membrane normal.     Mouth/Throat:     Mouth: Mucous membranes are moist.  Eyes:     Pupils: Pupils are equal, round, and reactive to light.  Cardiovascular:     Rate and Rhythm: Normal rate.  Pulmonary:     Effort: Pulmonary effort is normal.  Abdominal:     General: Abdomen is flat. There is no distension.  Musculoskeletal:        General: Normal range of motion.     Cervical back: Normal range of motion.  Skin:    General: Skin is warm.  Neurological:     General: No focal deficit present.     Mental Status: He is alert and oriented to person, place, and time.     ED Results / Procedures / Treatments   Labs (all labs ordered are listed, but only abnormal results are displayed) Labs Reviewed  RESP PANEL BY RT-PCR (RSV, FLU A&B, COVID)  [id number]    EKG  None  Radiology DG Chest 2 View  Result Date: 01/25/2022 CLINICAL DATA:  Cough EXAM: CHEST - 2 VIEW COMPARISON:  April 05, 2019 FINDINGS: Mild bibasilar opacities. The heart, hila, mediastinum, lungs, and pleura are otherwise unremarkable. IMPRESSION: Mild bibasilar opacities may represent atelectasis or developing infiltrate. Recommend short-term follow-up imaging to ensure resolution. Electronically Signed   By: Dorise Bullion III M.D.   On: 01/25/2022 14:03    Procedures Procedures    Medications Ordered in ED Medications - No data to display  ED Course/ Medical Decision Making/ A&P                           Medical Decision Making Pt complains of a cough and congestion.  Pt reports he has had for 4 days.  Pt was given a shot by his MD on Thursday.    Amount and/or  Complexity of Data Reviewed Labs: ordered. Decision-making details documented in ED Course.    Details: Labs ordered reviewed and interpreted.  Covid, Influenza and Rsv are negative.  Radiology: ordered and independent interpretation performed. Decision-making details documented in ED Course.    Details: Chest xray ordered and shows possible pneumonia.  Pt advised of need for follow up xray.   Risk Prescription drug management. Risk Details: Pt given rx for levaquin and tessalon perles.  Pt advised to see his provider for recheck in 3-4 days.            Final Clinical Impression(s) / ED Diagnoses Final diagnoses:  Community acquired pneumonia, unspecified laterality    Rx / DC Orders ED Discharge Orders          Ordered    levofloxacin (LEVAQUIN) 500 MG tablet  Daily        01/25/22 1502    benzonatate (TESSALON PERLES) 100 MG capsule  Every 6 hours PRN        01/25/22 1502          An After Visit Summary was printed and given to the patient.     Fransico Meadow, [place] 01/25/22 1502    Fransico Meadow, MD 01/29/22 1037

## 2022-01-25 NOTE — ED Triage Notes (Addendum)
Reports cough with white sputum since at least Thursday.  Saw his PCP on Friday received an injection of some sort and was told he would be fine but he doesn't feel better.

## 2022-01-27 DIAGNOSIS — J069 Acute upper respiratory infection, unspecified: Secondary | ICD-10-CM | POA: Diagnosis not present

## 2022-01-27 DIAGNOSIS — R059 Cough, unspecified: Secondary | ICD-10-CM | POA: Diagnosis not present

## 2022-02-02 DIAGNOSIS — I10 Essential (primary) hypertension: Secondary | ICD-10-CM | POA: Diagnosis not present

## 2022-02-02 DIAGNOSIS — J209 Acute bronchitis, unspecified: Secondary | ICD-10-CM | POA: Diagnosis not present

## 2022-02-09 DIAGNOSIS — I10 Essential (primary) hypertension: Secondary | ICD-10-CM | POA: Diagnosis not present

## 2022-02-09 DIAGNOSIS — B349 Viral infection, unspecified: Secondary | ICD-10-CM | POA: Diagnosis not present

## 2022-02-09 DIAGNOSIS — J0100 Acute maxillary sinusitis, unspecified: Secondary | ICD-10-CM | POA: Diagnosis not present

## 2022-02-17 DIAGNOSIS — R7989 Other specified abnormal findings of blood chemistry: Secondary | ICD-10-CM | POA: Diagnosis not present

## 2022-02-17 DIAGNOSIS — Z0001 Encounter for general adult medical examination with abnormal findings: Secondary | ICD-10-CM | POA: Diagnosis not present

## 2022-02-17 DIAGNOSIS — E039 Hypothyroidism, unspecified: Secondary | ICD-10-CM | POA: Diagnosis not present

## 2022-05-21 DIAGNOSIS — D692 Other nonthrombocytopenic purpura: Secondary | ICD-10-CM | POA: Diagnosis not present

## 2022-05-21 DIAGNOSIS — Z0001 Encounter for general adult medical examination with abnormal findings: Secondary | ICD-10-CM | POA: Diagnosis not present

## 2022-10-06 DIAGNOSIS — S335XXA Sprain of ligaments of lumbar spine, initial encounter: Secondary | ICD-10-CM | POA: Diagnosis not present

## 2022-10-29 DIAGNOSIS — Z23 Encounter for immunization: Secondary | ICD-10-CM | POA: Diagnosis not present

## 2022-11-03 DIAGNOSIS — D225 Melanocytic nevi of trunk: Secondary | ICD-10-CM | POA: Diagnosis not present

## 2022-11-03 DIAGNOSIS — L820 Inflamed seborrheic keratosis: Secondary | ICD-10-CM | POA: Diagnosis not present

## 2022-11-03 DIAGNOSIS — B078 Other viral warts: Secondary | ICD-10-CM | POA: Diagnosis not present

## 2022-11-03 DIAGNOSIS — L821 Other seborrheic keratosis: Secondary | ICD-10-CM | POA: Diagnosis not present

## 2022-11-04 DIAGNOSIS — H25811 Combined forms of age-related cataract, right eye: Secondary | ICD-10-CM | POA: Diagnosis not present

## 2022-11-04 DIAGNOSIS — H40013 Open angle with borderline findings, low risk, bilateral: Secondary | ICD-10-CM | POA: Diagnosis not present

## 2022-11-04 DIAGNOSIS — Z961 Presence of intraocular lens: Secondary | ICD-10-CM | POA: Diagnosis not present

## 2022-11-26 DIAGNOSIS — R7989 Other specified abnormal findings of blood chemistry: Secondary | ICD-10-CM | POA: Diagnosis not present

## 2022-11-26 DIAGNOSIS — A6922 Other neurologic disorders in Lyme disease: Secondary | ICD-10-CM | POA: Diagnosis not present

## 2022-11-26 DIAGNOSIS — I10 Essential (primary) hypertension: Secondary | ICD-10-CM | POA: Diagnosis not present

## 2022-11-26 DIAGNOSIS — E782 Mixed hyperlipidemia: Secondary | ICD-10-CM | POA: Diagnosis not present

## 2022-11-26 DIAGNOSIS — Z0001 Encounter for general adult medical examination with abnormal findings: Secondary | ICD-10-CM | POA: Diagnosis not present

## 2022-11-26 DIAGNOSIS — E559 Vitamin D deficiency, unspecified: Secondary | ICD-10-CM | POA: Diagnosis not present

## 2022-11-26 DIAGNOSIS — E039 Hypothyroidism, unspecified: Secondary | ICD-10-CM | POA: Diagnosis not present

## 2022-11-26 DIAGNOSIS — R269 Unspecified abnormalities of gait and mobility: Secondary | ICD-10-CM | POA: Diagnosis not present
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
## 2022-12-15 ENCOUNTER — Encounter: Payer: Self-pay | Admitting: Urology

## 2022-12-15 ENCOUNTER — Ambulatory Visit: Payer: Medicare Other | Admitting: Urology

## 2022-12-15 VITALS — BP 155/89 | HR 58

## 2022-12-15 DIAGNOSIS — C61 Malignant neoplasm of prostate: Secondary | ICD-10-CM

## 2022-12-15 DIAGNOSIS — Z08 Encounter for follow-up examination after completed treatment for malignant neoplasm: Secondary | ICD-10-CM | POA: Diagnosis not present

## 2022-12-15 DIAGNOSIS — Z8546 Personal history of malignant neoplasm of prostate: Secondary | ICD-10-CM | POA: Diagnosis not present

## 2022-12-15 MED ORDER — OXYBUTYNIN CHLORIDE ER 5 MG PO TB24: 5.0000 mg | ORAL_TABLET | Freq: Every evening | ORAL | 3 refills | Status: DC | PRN

## 2022-12-16 LAB — PSA: Prostate Specific Ag, Serum: <0.1 ng/mL (ref 0.0–4.0)

## 2022-12-17 NOTE — Progress Notes (Signed)
Letter sent.

## 2023-06-11 DIAGNOSIS — J0100 Acute maxillary sinusitis, unspecified: Secondary | ICD-10-CM | POA: Diagnosis not present

## 2023-06-11 DIAGNOSIS — I10 Essential (primary) hypertension: Secondary | ICD-10-CM | POA: Diagnosis not present

## 2023-06-11 DIAGNOSIS — A6922 Other neurologic disorders in Lyme disease: Secondary | ICD-10-CM | POA: Diagnosis not present

## 2023-07-01 ENCOUNTER — Encounter (INDEPENDENT_AMBULATORY_CARE_PROVIDER_SITE_OTHER): Payer: Self-pay | Admitting: *Deleted

## 2023-07-27 ENCOUNTER — Ambulatory Visit: Admitting: Internal Medicine

## 2023-07-27 ENCOUNTER — Encounter: Payer: Self-pay | Admitting: Internal Medicine

## 2023-07-27 VITALS — BP 158/90 | HR 56 | Temp 98.0°F | Ht 69.0 in | Wt 193.2 lb

## 2023-07-27 DIAGNOSIS — Z860101 Personal history of adenomatous and serrated colon polyps: Secondary | ICD-10-CM | POA: Diagnosis not present

## 2023-07-27 DIAGNOSIS — Z09 Encounter for follow-up examination after completed treatment for conditions other than malignant neoplasm: Secondary | ICD-10-CM

## 2023-07-27 DIAGNOSIS — Z860100 Personal history of colon polyps, unspecified: Secondary | ICD-10-CM
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
## 2023-07-27 NOTE — Patient Instructions (Signed)
 It was good to see you again today!  As discussed you are due for colonoscopy at this time due to history of polyps removed previously  Will schedule a surveillance colonoscopy-history of polyps in the near future (ASA 3 but room 1 okay)  Further recommendations to follow.

## 2023-07-27 NOTE — Progress Notes (Signed)
 Darin Williamson

## 2023-08-09 ENCOUNTER — Telehealth: Payer: Self-pay | Admitting: *Deleted

## 2023-08-09 ENCOUNTER — Encounter: Payer: Self-pay | Admitting: *Deleted

## 2023-08-09 ENCOUNTER — Other Ambulatory Visit: Payer: Self-pay | Admitting: *Deleted

## 2023-08-09 MED ORDER — PEG 3350-KCL-NA BICARB-NACL 420 G PO SOLR: 4000.0000 mL | Freq: Once | ORAL | 0 refills | Status: AC

## 2023-08-09 NOTE — Telephone Encounter (Signed)
 Attempted to call pt, home # not in service, mobile # vm not set up.

## 2023-08-09 NOTE — Telephone Encounter (Signed)
 Pt came by office, procedure scheduled for 09/06/23. Instructions printed and gave to pt. Prep sent to pharmacy.

## 2023-08-31 ENCOUNTER — Encounter (HOSPITAL_COMMUNITY): Payer: Self-pay | Admitting: Internal Medicine

## 2023-08-31 DIAGNOSIS — Z122 Encounter for screening for malignant neoplasm of respiratory organs: Secondary | ICD-10-CM | POA: Diagnosis not present

## 2023-08-31 DIAGNOSIS — I10 Essential (primary) hypertension: Secondary | ICD-10-CM | POA: Diagnosis not present

## 2023-08-31 DIAGNOSIS — M159 Polyosteoarthritis, unspecified: Secondary | ICD-10-CM | POA: Diagnosis not present

## 2023-08-31 DIAGNOSIS — A6922 Other neurologic disorders in Lyme disease: Secondary | ICD-10-CM | POA: Diagnosis not present

## 2023-08-31 DIAGNOSIS — Z0001 Encounter for general adult medical examination with abnormal findings: Secondary | ICD-10-CM | POA: Diagnosis not present

## 2023-09-06 ENCOUNTER — Ambulatory Visit (HOSPITAL_COMMUNITY): Admitting: Anesthesiology

## 2023-09-06 ENCOUNTER — Ambulatory Visit (HOSPITAL_COMMUNITY)
Admission: RE | Admit: 2023-09-06 | Discharge: 2023-09-06 | Disposition: A | Discharge status: Home / Self Care | Attending: Internal Medicine | Admitting: Internal Medicine

## 2023-09-06 ENCOUNTER — Encounter (HOSPITAL_COMMUNITY): Payer: Self-pay | Admitting: Internal Medicine

## 2023-09-06 ENCOUNTER — Encounter (HOSPITAL_COMMUNITY)
Admission: RE | Disposition: A | Discharge status: Home / Self Care | Payer: Self-pay | Source: Home / Self Care | Attending: Internal Medicine

## 2023-09-06 ENCOUNTER — Other Ambulatory Visit: Payer: Self-pay

## 2023-09-06 DIAGNOSIS — Z87891 Personal history of nicotine dependence: Secondary | ICD-10-CM | POA: Diagnosis not present

## 2023-09-06 DIAGNOSIS — Z860100 Personal history of colon polyps, unspecified: Secondary | ICD-10-CM | POA: Insufficient documentation

## 2023-09-06 DIAGNOSIS — K5730 Diverticulosis of large intestine without perforation or abscess without bleeding: Secondary | ICD-10-CM

## 2023-09-06 DIAGNOSIS — Z09 Encounter for follow-up examination after completed treatment for conditions other than malignant neoplasm: Secondary | ICD-10-CM | POA: Insufficient documentation

## 2023-09-06 DIAGNOSIS — Z1211 Encounter for screening for malignant neoplasm of colon: Secondary | ICD-10-CM

## 2023-09-06 DIAGNOSIS — I10 Essential (primary) hypertension: Secondary | ICD-10-CM | POA: Diagnosis not present

## 2023-09-06 DIAGNOSIS — Z8546 Personal history of malignant neoplasm of prostate: Secondary | ICD-10-CM | POA: Diagnosis not present

## 2023-09-06 HISTORY — PX: COLONOSCOPY: SHX5424

## 2023-09-06 SURGERY — COLONOSCOPY
Anesthesia: General

## 2023-09-06 MED ORDER — LACTATED RINGERS IV SOLN: INTRAVENOUS | Status: DC | PRN

## 2023-09-06 MED ORDER — LACTATED RINGERS IV SOLN: INTRAVENOUS | Status: DC

## 2023-09-06 MED ORDER — LIDOCAINE 2% (20 MG/ML) 5 ML SYRINGE
INTRAMUSCULAR | Status: DC | PRN
  Administered 2023-09-06 (×2): 60 mg via INTRAVENOUS

## 2023-09-06 MED ORDER — PROPOFOL 10 MG/ML IV BOLUS
INTRAVENOUS | Status: DC | PRN
  Administered 2023-09-06: 80 mg via INTRAVENOUS
  Administered 2023-09-06 (×2): 125 ug/kg/min via INTRAVENOUS
  Administered 2023-09-06: 80 mg via INTRAVENOUS

## 2023-09-06 NOTE — Discharge Instructions (Signed)
  Colonoscopy Discharge Instructions  Read the instructions outlined below and refer to this sheet in the next few weeks. These discharge instructions provide you with general information on caring for yourself after you leave the hospital. Your doctor may also give you specific instructions. While your treatment has been planned according to the most current medical practices available, unavoidable complications occasionally occur. If you have any problems or questions after discharge, call Dr. Shaaron at [phone]. ACTIVITY You may resume your regular activity, but move at a slower pace for the next 24 hours.  Take frequent rest periods for the next 24 hours.  Walking will help get rid of the air and reduce the bloated feeling in your belly (abdomen).  No driving for 24 hours (because of the medicine (anesthesia) used during the test).   Do not sign any important legal documents or operate any machinery for 24 hours (because of the anesthesia used during the test).  NUTRITION Drink plenty of fluids.  You may resume your normal diet as instructed by your doctor.  Begin with a light meal and progress to your normal diet. Heavy or fried foods are harder to digest and may make you feel sick to your stomach (nauseated).  Avoid alcoholic beverages for 24 hours or as instructed.  MEDICATIONS You may resume your normal medications unless your doctor tells you otherwise.  WHAT YOU CAN EXPECT TODAY Some feelings of bloating in the abdomen.  Passage of more gas than usual.  Spotting of blood in your stool or on the toilet paper.  IF YOU HAD POLYPS REMOVED DURING THE COLONOSCOPY: No aspirin  products for 7 days or as instructed.  No alcohol for 7 days or as instructed.  Eat a soft diet for the next 24 hours.  FINDING OUT THE RESULTS OF YOUR TEST Not all test results are available during your visit. If your test results are not back during the visit, make an appointment with your caregiver to find out the  results. Do not assume everything is normal if you have not heard from your caregiver or the medical facility. It is important for you to follow up on all of your test results.  SEEK IMMEDIATE MEDICAL ATTENTION IF: You have more than a spotting of blood in your stool.  Your belly is swollen (abdominal distention).  You are nauseated or vomiting.  You have a temperature over 101.  You have abdominal pain or discomfort that is severe or gets worse throughout the day.     No polyps found today  Diverticulosis present   recommend 1 more colonoscopy in 3 years if your overall health permits

## 2023-09-06 NOTE — Op Note (Signed)
 [facility] Patient Name: Darin Williamson Procedure Date: 09/06/2023 8:20 AM MRN: [id number] Date of Birth: 15-Jun-1945 Attending MD: Lamar Ozell Hollingshead , MD, [id number] CSN: [id number] Age: 78 Admit Type: Outpatient Procedure:                Colonoscopy Indications:              High risk colon cancer surveillance: Personal                            history of colonic polyps Providers:                Lamar Ozell Hollingshead, MD, Harlene Lips, [place]                            Wilson, Technician Referring MD:              Medicines:                Propofol  per Anesthesia Complications:            No immediate complications. Estimated Blood Loss:     Estimated blood loss: none. . Procedure:                Pre-Anesthesia Assessment:                           - Prior to the procedure, a History and Physical                            was performed, and patient medications and                            allergies were reviewed. The patient's tolerance of                            previous anesthesia was also reviewed. The risks                            and benefits of the procedure and the sedation                            options and risks were discussed with the patient.                            All questions were answered, and informed consent                            was obtained. Prior Anticoagulants: The patient has                            taken no anticoagulant or antiplatelet agents. ASA                            Grade Assessment: III - A patient with severe  systemic disease. After reviewing the risks and                            benefits, the patient was deemed in satisfactory                            condition to undergo the procedure.                           After obtaining informed consent, the colonoscope                            was passed under direct vision. Throughout the                            procedure, the  patient's blood pressure, pulse, and                            oxygen saturations were monitored continuously. The                            [id number]) scope was introduced through the                            anus and advanced to the the cecum, identified by                            appendiceal orifice and ileocecal valve. The                            colonoscopy was performed without difficulty. The                            patient tolerated the procedure well. The quality                            of the bowel preparation was adequate. The                            ileocecal valve, appendiceal orifice, and rectum                            were photographed. Scope In: 8:42:28 AM Scope Out: 8:53:33 AM Scope Withdrawal Time: 0 hours 6 minutes 23 seconds  Total Procedure Duration: 0 hours 11 minutes 5 seconds  Findings:      The perianal and digital rectal examinations were normal.      Scattered medium-mouthed diverticula were found in the sigmoid colon and       descending colon. Previously noted tattoo readily identified in the       ascending segment. The ascending segment was examined twice. No residual       or new polyp was found.      The exam was otherwise without abnormality on direct and retroflexion       views. Impression:               -  Diverticulosis in the sigmoid colon and in the                            descending colon.                           - The examination was otherwise normal on direct                            and retroflexion views.                           . Moderate Sedation:      Moderate (conscious) sedation was personally administered by an       anesthesia professional. The following parameters were monitored: oxygen       saturation, heart rate, blood pressure, respiratory rate, EKG, adequacy       of pulmonary ventilation, and response to care. Recommendation:           - Patient has a contact number available for                             emergencies. The signs and symptoms of potential                            delayed complications were discussed with the                            patient. Return to normal activities tomorrow.                            Written discharge instructions were provided to the                            patient.                           - Advance diet as tolerated.                           - Repeat colonoscopy in 3 years for surveillance if                            overall health permits. Procedure Code(s):        --- Professional ---                           [phone], Colonoscopy, flexible; diagnostic, including                            collection of specimen(s) by brushing or washing,                            when performed (separate procedure) Diagnosis Code(s):        --- Professional ---  Z86.010, Personal history of colonic polyps                           K57.30, Diverticulosis of large intestine without                            perforation or abscess without bleeding CPT copyright 2022 American Medical Association. All rights reserved. The codes documented in this report are preliminary and upon coder review may  be revised to meet current compliance requirements. Lamar HERO. Oneal Biglow, MD Lamar Ozell Hollingshead, MD 09/06/2023 9:05:49 AM This report has been signed electronically. Number of Addenda: 0

## 2023-09-06 NOTE — Transfer of Care (Signed)
 Immediate Anesthesia Transfer of Care Note  Patient: Darin Williamson  Procedure(s) Performed: COLONOSCOPY  Patient Location: PACU  Anesthesia Type:General  Level of Consciousness: awake, alert , and patient cooperative  Airway & Oxygen Therapy: Patient Spontanous Breathing  Post-op Assessment: Report given to RN, Post -op Vital signs reviewed and stable, and Patient moving all extremities X 4  Post vital signs: Reviewed and stable  Last Vitals:  Vitals Value Taken Time  BP 81/51 09/06/23 08:57  Temp 36.6 C 09/06/23 08:57  Pulse 62 09/06/23 08:57  Resp 17 09/06/23 08:57  SpO2 96 % 09/06/23 08:57    Last Pain:  Vitals:   09/06/23 0857  TempSrc: Oral  PainSc:       Patients Stated Pain Goal: 7 (09/06/23 0752)  Complications: No notable events documented.
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
## 2023-09-06 NOTE — Anesthesia Postprocedure Evaluation (Signed)
 Anesthesia Post Note  Patient: Darin Williamson  Procedure(s) Performed: COLONOSCOPY  Patient location during evaluation: Endoscopy Anesthesia Type: General Level of consciousness: awake and alert Pain management: pain level controlled Vital Signs Assessment: post-procedure vital signs reviewed and stable Respiratory status: spontaneous breathing, nonlabored ventilation and respiratory function stable Cardiovascular status: blood pressure returned to baseline and stable Postop Assessment: no apparent nausea or vomiting Anesthetic complications: no   There were no known notable events for this encounter.   Last Vitals:  Vitals:   09/06/23 0907 09/06/23 0910  BP: 118/66 (!) 140/72  Pulse: 72 73  Resp: 17 18  Temp:    SpO2: 95% 95%    Last Pain:  Vitals:   09/06/23 0910  TempSrc:   PainSc: 0-No pain                 Faysal Fenoglio L Nakeya Adinolfi

## 2023-09-07 ENCOUNTER — Encounter (HOSPITAL_COMMUNITY): Payer: Self-pay | Admitting: Internal Medicine

## 2023-12-21 ENCOUNTER — Ambulatory Visit: Payer: Medicare Other | Admitting: Urology

## 2023-12-22 ENCOUNTER — Other Ambulatory Visit: Payer: Self-pay | Admitting: Urology

## 2023-12-22 DIAGNOSIS — C61 Malignant neoplasm of prostate: Secondary | ICD-10-CM

## 2024-04-04 ENCOUNTER — Ambulatory Visit: Admitting: Urology
